# Patient Record
Sex: Male | Born: 1970
Health system: Southern US, Community
[De-identification: ages and names within clinical notes are randomized; demographics above are authoritative.]

## PROBLEM LIST (undated history)

## (undated) HISTORY — PX: ORIF WRIST FRACTURE: SHX2133

## (undated) HISTORY — PX: WRIST SURGERY: SHX841

---

## 2012-05-31 ENCOUNTER — Emergency Department (HOSPITAL_COMMUNITY): Payer: Medicare Other

## 2012-05-31 ENCOUNTER — Encounter (HOSPITAL_COMMUNITY): Payer: Self-pay | Admitting: Anesthesiology

## 2012-05-31 ENCOUNTER — Encounter (HOSPITAL_COMMUNITY)
Admission: EM | Disposition: A | Discharge status: Home / Self Care | Payer: Self-pay | Source: Home / Self Care | Attending: Orthopaedic Surgery

## 2012-05-31 ENCOUNTER — Inpatient Hospital Stay (HOSPITAL_COMMUNITY)
Admission: EM | Admit: 2012-05-31 | Discharge: 2012-06-06 | DRG: 481 | Disposition: A | Discharge status: Home / Self Care | Payer: Medicare Other | Attending: Orthopaedic Surgery | Admitting: Orthopaedic Surgery

## 2012-05-31 ENCOUNTER — Inpatient Hospital Stay (HOSPITAL_COMMUNITY): Payer: Medicare Other

## 2012-05-31 ENCOUNTER — Emergency Department (HOSPITAL_COMMUNITY): Payer: Medicare Other | Admitting: Anesthesiology

## 2012-05-31 DIAGNOSIS — S72302A Unspecified fracture of shaft of left femur, initial encounter for closed fracture: Secondary | ICD-10-CM | POA: Diagnosis present

## 2012-05-31 DIAGNOSIS — S82109A Unspecified fracture of upper end of unspecified tibia, initial encounter for closed fracture: Secondary | ICD-10-CM | POA: Diagnosis present

## 2012-05-31 DIAGNOSIS — S7290XA Unspecified fracture of unspecified femur, initial encounter for closed fracture: Secondary | ICD-10-CM

## 2012-05-31 DIAGNOSIS — S86909A Unspecified injury of unspecified muscle(s) and tendon(s) at lower leg level, unspecified leg, initial encounter: Secondary | ICD-10-CM | POA: Diagnosis present

## 2012-05-31 DIAGNOSIS — S81809A Unspecified open wound, unspecified lower leg, initial encounter: Secondary | ICD-10-CM

## 2012-05-31 DIAGNOSIS — S92109A Unspecified fracture of unspecified talus, initial encounter for closed fracture: Secondary | ICD-10-CM | POA: Diagnosis present

## 2012-05-31 DIAGNOSIS — S82122A Displaced fracture of lateral condyle of left tibia, initial encounter for closed fracture: Secondary | ICD-10-CM | POA: Diagnosis present

## 2012-05-31 DIAGNOSIS — S92102A Unspecified fracture of left talus, initial encounter for closed fracture: Secondary | ICD-10-CM | POA: Diagnosis present

## 2012-05-31 DIAGNOSIS — S91009A Unspecified open wound, unspecified ankle, initial encounter: Secondary | ICD-10-CM

## 2012-05-31 DIAGNOSIS — S7292XA Unspecified fracture of left femur, initial encounter for closed fracture: Secondary | ICD-10-CM

## 2012-05-31 DIAGNOSIS — D62 Acute posthemorrhagic anemia: Secondary | ICD-10-CM

## 2012-05-31 DIAGNOSIS — S72309A Unspecified fracture of shaft of unspecified femur, initial encounter for closed fracture: Principal | ICD-10-CM | POA: Diagnosis present

## 2012-05-31 DIAGNOSIS — S86819A Strain of other muscle(s) and tendon(s) at lower leg level, unspecified leg, initial encounter: Secondary | ICD-10-CM | POA: Diagnosis present

## 2012-05-31 DIAGNOSIS — S82209A Unspecified fracture of shaft of unspecified tibia, initial encounter for closed fracture: Secondary | ICD-10-CM

## 2012-05-31 DIAGNOSIS — S43203A Unspecified subluxation of unspecified sternoclavicular joint, initial encounter: Secondary | ICD-10-CM | POA: Diagnosis present

## 2012-05-31 DIAGNOSIS — T07XXXA Unspecified multiple injuries, initial encounter: Secondary | ICD-10-CM

## 2012-05-31 DIAGNOSIS — S81009A Unspecified open wound, unspecified knee, initial encounter: Secondary | ICD-10-CM

## 2012-05-31 HISTORY — PX: EXTERNAL FIXATION LEG: SHX1549

## 2012-05-31 HISTORY — PX: I & D EXTREMITY: SHX5045

## 2012-05-31 LAB — CBC WITH DIFFERENTIAL/PLATELET
Basophils Relative: 0 % (ref 0–1)
Eosinophils Relative: 0 % (ref 0–5)
Hemoglobin: 14 g/dL (ref 13.0–17.0)
Monocytes Relative: 7 % (ref 3–12)
Neutrophils Relative %: 71 % (ref 43–77)
Platelets: 193 10*3/uL (ref 150–400)
RBC: 4.59 MIL/uL (ref 4.22–5.81)
WBC: 15.4 10*3/uL — ABNORMAL HIGH (ref 4.0–10.5)

## 2012-05-31 LAB — POCT I-STAT, CHEM 8
Calcium, Ion: 1.12 mmol/L (ref 1.12–1.23)
Glucose, Bld: 142 mg/dL — ABNORMAL HIGH (ref 70–99)
HCT: 41 % (ref 39.0–52.0)
Hemoglobin: 13.9 g/dL (ref 13.0–17.0)
Potassium: 3.5 mEq/L (ref 3.5–5.1)

## 2012-05-31 LAB — COMPREHENSIVE METABOLIC PANEL
Albumin: 3.4 g/dL — ABNORMAL LOW (ref 3.5–5.2)
BUN: 12 mg/dL (ref 6–23)
Chloride: 106 mEq/L (ref 96–112)
Creatinine, Ser: 1.21 mg/dL (ref 0.50–1.35)
Total Bilirubin: 0.2 mg/dL — ABNORMAL LOW (ref 0.3–1.2)

## 2012-05-31 LAB — PROTIME-INR
INR: 1.05 (ref 0.00–1.49)
Prothrombin Time: 13.6 seconds (ref 11.6–15.2)

## 2012-05-31 LAB — ABO/RH: ABO/RH(D): O POS

## 2012-05-31 SURGERY — IRRIGATION AND DEBRIDEMENT EXTREMITY
Anesthesia: General | Site: Leg Upper | Laterality: Left | Wound class: Contaminated

## 2012-05-31 MED ORDER — HYDROMORPHONE HCL PF 1 MG/ML IJ SOLN
1.0000 mg | Freq: Once | INTRAMUSCULAR | Status: AC
  Administered 2012-05-31: 1 mg via INTRAVENOUS
  Filled 2012-05-31: qty 1

## 2012-05-31 MED ORDER — ROCURONIUM BROMIDE 100 MG/10ML IV SOLN
INTRAVENOUS | Status: DC | PRN
  Administered 2012-05-31: 40 mg via INTRAVENOUS

## 2012-05-31 MED ORDER — HYDROMORPHONE HCL PF 1 MG/ML IJ SOLN
1.0000 mg | INTRAMUSCULAR | Status: DC | PRN
  Administered 2012-05-31 – 2012-06-01 (×4): 1 mg via INTRAVENOUS
  Filled 2012-05-31 (×4): qty 1

## 2012-05-31 MED ORDER — FENTANYL CITRATE 0.05 MG/ML IJ SOLN
INTRAMUSCULAR | Status: AC
  Filled 2012-05-31: qty 4

## 2012-05-31 MED ORDER — PROPOFOL 10 MG/ML IV BOLUS
INTRAVENOUS | Status: DC | PRN
  Administered 2012-05-31: 200 mg via INTRAVENOUS

## 2012-05-31 MED ORDER — ENOXAPARIN SODIUM 30 MG/0.3ML ~~LOC~~ SOLN
30.0000 mg | Freq: Two times a day (BID) | SUBCUTANEOUS | Status: DC
  Administered 2012-05-31: 30 mg via SUBCUTANEOUS
  Filled 2012-05-31 (×4): qty 0.3

## 2012-05-31 MED ORDER — METHOCARBAMOL 500 MG PO TABS
500.0000 mg | ORAL_TABLET | Freq: Four times a day (QID) | ORAL | Status: DC | PRN
  Administered 2012-05-31 – 2012-06-02 (×5): 500 mg via ORAL
  Filled 2012-05-31 (×4): qty 1

## 2012-05-31 MED ORDER — FENTANYL CITRATE 0.05 MG/ML IJ SOLN
INTRAMUSCULAR | Status: AC
  Filled 2012-05-31: qty 2

## 2012-05-31 MED ORDER — CEFAZOLIN SODIUM 1-5 GM-% IV SOLN
1.0000 g | Freq: Three times a day (TID) | INTRAVENOUS | Status: DC
  Administered 2012-05-31 – 2012-06-03 (×10): 1 g via INTRAVENOUS
  Filled 2012-05-31 (×14): qty 50

## 2012-05-31 MED ORDER — ZOLPIDEM TARTRATE 5 MG PO TABS: 5.0000 mg | ORAL_TABLET | Freq: Every evening | ORAL | Status: DC | PRN

## 2012-05-31 MED ORDER — SUCCINYLCHOLINE CHLORIDE 20 MG/ML IJ SOLN
INTRAMUSCULAR | Status: DC | PRN
  Administered 2012-05-31: 100 mg via INTRAVENOUS

## 2012-05-31 MED ORDER — METHOCARBAMOL 100 MG/ML IJ SOLN
500.0000 mg | Freq: Four times a day (QID) | INTRAVENOUS | Status: DC | PRN
  Administered 2012-06-01: 500 mg via INTRAVENOUS
  Filled 2012-05-31 (×3): qty 5

## 2012-05-31 MED ORDER — METOCLOPRAMIDE HCL 5 MG/ML IJ SOLN: 10.0000 mg | Freq: Once | INTRAMUSCULAR | Status: DC | PRN

## 2012-05-31 MED ORDER — SODIUM CHLORIDE 0.9 % IV SOLN
10.0000 mg | INTRAVENOUS | Status: DC | PRN
  Administered 2012-05-31: 25 ug/min via INTRAVENOUS

## 2012-05-31 MED ORDER — DIPHENHYDRAMINE HCL 12.5 MG/5ML PO ELIX
12.5000 mg | ORAL_SOLUTION | ORAL | Status: DC | PRN
  Administered 2012-06-02: 25 mg via ORAL

## 2012-05-31 MED ORDER — SODIUM CHLORIDE 0.9 % IV SOLN
INTRAVENOUS | Status: DC
  Administered 2012-06-01: 01:00:00 via INTRAVENOUS

## 2012-05-31 MED ORDER — OXYCODONE HCL 5 MG/5ML PO SOLN: 5.0000 mg | Freq: Once | ORAL | Status: DC | PRN

## 2012-05-31 MED ORDER — HYDROMORPHONE HCL PF 1 MG/ML IJ SOLN
0.2500 mg | INTRAMUSCULAR | Status: DC | PRN
  Administered 2012-05-31 (×2): 0.5 mg via INTRAVENOUS

## 2012-05-31 MED ORDER — CEFAZOLIN SODIUM-DEXTROSE 2-3 GM-% IV SOLR
INTRAVENOUS | Status: DC | PRN
  Administered 2012-05-31: 2 g via INTRAVENOUS

## 2012-05-31 MED ORDER — FENTANYL CITRATE 0.05 MG/ML IJ SOLN
150.0000 ug | Freq: Once | INTRAMUSCULAR | Status: AC
  Administered 2012-05-31: 150 ug via INTRAVENOUS

## 2012-05-31 MED ORDER — PHENYLEPHRINE HCL 10 MG/ML IJ SOLN
INTRAMUSCULAR | Status: DC | PRN
  Administered 2012-05-31 (×4): 80 ug via INTRAVENOUS

## 2012-05-31 MED ORDER — IOHEXOL 300 MG/ML  SOLN
100.0000 mL | Freq: Once | INTRAMUSCULAR | Status: AC | PRN
  Administered 2012-05-31: 100 mL via INTRAVENOUS

## 2012-05-31 MED ORDER — OXYCODONE HCL 5 MG PO TABS: 5.0000 mg | ORAL_TABLET | Freq: Once | ORAL | Status: DC | PRN

## 2012-05-31 MED ORDER — METOCLOPRAMIDE HCL 5 MG/ML IJ SOLN: 5.0000 mg | Freq: Three times a day (TID) | INTRAMUSCULAR | Status: DC | PRN

## 2012-05-31 MED ORDER — LACTATED RINGERS IV SOLN
INTRAVENOUS | Status: DC | PRN
  Administered 2012-05-31 (×3): via INTRAVENOUS

## 2012-05-31 MED ORDER — ONDANSETRON HCL 4 MG PO TABS: 4.0000 mg | ORAL_TABLET | Freq: Four times a day (QID) | ORAL | Status: DC | PRN

## 2012-05-31 MED ORDER — TETANUS-DIPHTHERIA TOXOIDS TD 5-2 LFU IM INJ
0.5000 mL | INJECTION | Freq: Once | INTRAMUSCULAR | Status: AC
  Administered 2012-05-31: 0.5 mL via INTRAMUSCULAR
  Filled 2012-05-31: qty 0.5

## 2012-05-31 MED ORDER — NEOSTIGMINE METHYLSULFATE 1 MG/ML IJ SOLN
INTRAMUSCULAR | Status: DC | PRN
  Administered 2012-05-31: 2 mg via INTRAVENOUS

## 2012-05-31 MED ORDER — ALBUMIN HUMAN 5 % IV SOLN
INTRAVENOUS | Status: DC | PRN
  Administered 2012-05-31: 06:00:00 via INTRAVENOUS

## 2012-05-31 MED ORDER — HYDROCODONE-ACETAMINOPHEN 5-325 MG PO TABS: 1.0000 | ORAL_TABLET | ORAL | Status: DC | PRN

## 2012-05-31 MED ORDER — DOCUSATE SODIUM 100 MG PO CAPS
100.0000 mg | ORAL_CAPSULE | Freq: Two times a day (BID) | ORAL | Status: DC
  Administered 2012-05-31 – 2012-06-03 (×4): 100 mg via ORAL
  Filled 2012-05-31 (×9): qty 1

## 2012-05-31 MED ORDER — LIDOCAINE HCL (CARDIAC) 20 MG/ML IV SOLN
INTRAVENOUS | Status: DC | PRN
  Administered 2012-05-31: 100 mg via INTRAVENOUS

## 2012-05-31 MED ORDER — MIDAZOLAM HCL 5 MG/5ML IJ SOLN
INTRAMUSCULAR | Status: DC | PRN
  Administered 2012-05-31: 2 mg via INTRAVENOUS

## 2012-05-31 MED ORDER — FENTANYL CITRATE 0.05 MG/ML IJ SOLN
100.0000 ug | Freq: Once | INTRAMUSCULAR | Status: AC
  Administered 2012-05-31: 100 ug via INTRAVENOUS

## 2012-05-31 MED ORDER — MORPHINE SULFATE 2 MG/ML IJ SOLN: 1.0000 mg | INTRAMUSCULAR | Status: DC | PRN

## 2012-05-31 MED ORDER — OXYCODONE HCL 5 MG PO TABS
5.0000 mg | ORAL_TABLET | ORAL | Status: DC | PRN
  Administered 2012-05-31 – 2012-06-06 (×8): 10 mg via ORAL
  Filled 2012-05-31 (×2): qty 2
  Filled 2012-05-31: qty 1
  Filled 2012-05-31 (×5): qty 2

## 2012-05-31 MED ORDER — METOCLOPRAMIDE HCL 10 MG PO TABS: 5.0000 mg | ORAL_TABLET | Freq: Three times a day (TID) | ORAL | Status: DC | PRN

## 2012-05-31 MED ORDER — GLYCOPYRROLATE 0.2 MG/ML IJ SOLN
INTRAMUSCULAR | Status: DC | PRN
  Administered 2012-05-31: 0.4 mg via INTRAVENOUS

## 2012-05-31 MED ORDER — ONDANSETRON HCL 4 MG/2ML IJ SOLN: 4.0000 mg | Freq: Four times a day (QID) | INTRAMUSCULAR | Status: DC | PRN

## 2012-05-31 SURGICAL SUPPLY — 70 items
BANDAGE CONFORM 3  STR LF (GAUZE/BANDAGES/DRESSINGS) IMPLANT
BANDAGE ELASTIC 3 VELCRO ST LF (GAUZE/BANDAGES/DRESSINGS) IMPLANT
BANDAGE ELASTIC 4 VELCRO ST LF (GAUZE/BANDAGES/DRESSINGS) ×3 IMPLANT
BANDAGE ELASTIC 6 VELCRO ST LF (GAUZE/BANDAGES/DRESSINGS) ×3 IMPLANT
BAR EXFIX SM BONE 10.5X250 (Trauma) ×3 IMPLANT
BAR EXFIX SM BONE 10.5X400 (Trauma) ×6 IMPLANT
BIT DRILL 3.5 SHOFT HALF PIN (BIT) ×3 IMPLANT
BLADE SURG 10 STRL SS (BLADE) IMPLANT
BNDG COHESIVE 1X5 TAN STRL LF (GAUZE/BANDAGES/DRESSINGS) IMPLANT
BNDG COHESIVE 4X5 TAN STRL (GAUZE/BANDAGES/DRESSINGS) IMPLANT
BNDG COHESIVE 6X5 TAN STRL LF (GAUZE/BANDAGES/DRESSINGS) IMPLANT
BNDG GAUZE STRTCH 6 (GAUZE/BANDAGES/DRESSINGS) IMPLANT
CLAMP BAR TO BAR (Clamp) ×9 IMPLANT
CLAMP PIN TO BAR (Clamp) ×30 IMPLANT
CLOTH BEACON ORANGE TIMEOUT ST (SAFETY) ×3 IMPLANT
CORDS BIPOLAR (ELECTRODE) IMPLANT
COVER SURGICAL LIGHT HANDLE (MISCELLANEOUS) ×3 IMPLANT
CUFF TOURNIQUET SINGLE 18IN (TOURNIQUET CUFF) IMPLANT
CUFF TOURNIQUET SINGLE 24IN (TOURNIQUET CUFF) IMPLANT
CUFF TOURNIQUET SINGLE 34IN LL (TOURNIQUET CUFF) IMPLANT
CUFF TOURNIQUET SINGLE 44IN (TOURNIQUET CUFF) IMPLANT
DRAPE C-ARM 42X72 X-RAY (DRAPES) ×6 IMPLANT
DRAPE C-ARMOR (DRAPES) ×3 IMPLANT
DRAPE ORTHO SPLIT 77X108 STRL (DRAPES) ×2
DRAPE SURG 17X23 STRL (DRAPES) IMPLANT
DRAPE SURG ORHT 6 SPLT 77X108 (DRAPES) ×4 IMPLANT
DRAPE U-SHAPE 47X51 STRL (DRAPES) ×3 IMPLANT
DRSG ADAPTIC 3X8 NADH LF (GAUZE/BANDAGES/DRESSINGS) ×9 IMPLANT
DRSG PAD ABDOMINAL 8X10 ST (GAUZE/BANDAGES/DRESSINGS) ×15 IMPLANT
DURAPREP 26ML APPLICATOR (WOUND CARE) IMPLANT
ELECT CAUTERY BLADE 6.4 (BLADE) IMPLANT
ELECT REM PT RETURN 9FT ADLT (ELECTROSURGICAL) ×3
ELECTRODE REM PT RTRN 9FT ADLT (ELECTROSURGICAL) ×2 IMPLANT
GAUZE XEROFORM 1X8 LF (GAUZE/BANDAGES/DRESSINGS) ×3 IMPLANT
GAUZE XEROFORM 5X9 LF (GAUZE/BANDAGES/DRESSINGS) ×3 IMPLANT
GLOVE BIOGEL PI IND STRL 8 (GLOVE) ×2 IMPLANT
GLOVE BIOGEL PI INDICATOR 8 (GLOVE) ×1
GLOVE ORTHO TXT STRL SZ7.5 (GLOVE) ×3 IMPLANT
GOWN PREVENTION PLUS LG XLONG (DISPOSABLE) IMPLANT
GOWN PREVENTION PLUS XLARGE (GOWN DISPOSABLE) ×3 IMPLANT
GOWN STRL NON-REIN LRG LVL3 (GOWN DISPOSABLE) ×3 IMPLANT
HALF PIN 45MM (Pin) ×6 IMPLANT
HALF PIN LONG  5X45 (Pin) ×12 IMPLANT
HANDPIECE INTERPULSE COAX TIP (DISPOSABLE) ×1
KIT BASIN OR (CUSTOM PROCEDURE TRAY) ×3 IMPLANT
KIT ROOM TURNOVER OR (KITS) ×3 IMPLANT
MANIFOLD NEPTUNE II (INSTRUMENTS) ×3 IMPLANT
NS IRRIG 1000ML POUR BTL (IV SOLUTION) ×3 IMPLANT
PACK ORTHO EXTREMITY (CUSTOM PROCEDURE TRAY) ×3 IMPLANT
PAD ARMBOARD 7.5X6 YLW CONV (MISCELLANEOUS) ×6 IMPLANT
PADDING CAST ABS 4INX4YD NS (CAST SUPPLIES) ×2
PADDING CAST ABS COTTON 4X4 ST (CAST SUPPLIES) ×4 IMPLANT
PADDING CAST COTTON 6X4 STRL (CAST SUPPLIES) ×6 IMPLANT
SET HNDPC FAN SPRY TIP SCT (DISPOSABLE) ×2 IMPLANT
SPONGE GAUZE 4X4 12PLY (GAUZE/BANDAGES/DRESSINGS) ×3 IMPLANT
SPONGE LAP 18X18 X RAY DECT (DISPOSABLE) ×6 IMPLANT
STOCKINETTE IMPERVIOUS 9X36 MD (GAUZE/BANDAGES/DRESSINGS) IMPLANT
SUT ETHILON 2 0 FS 18 (SUTURE) ×12 IMPLANT
SUT ETHILON 3 0 PS 1 (SUTURE) ×6 IMPLANT
SUT VIC AB 2-0 CT1 27 (SUTURE)
SUT VIC AB 2-0 CT1 TAPERPNT 27 (SUTURE) IMPLANT
SYR CONTROL 10ML LL (SYRINGE) IMPLANT
TOWEL OR 17X24 6PK STRL BLUE (TOWEL DISPOSABLE) ×3 IMPLANT
TOWEL OR 17X26 10 PK STRL BLUE (TOWEL DISPOSABLE) ×3 IMPLANT
TUBE ANAEROBIC SPECIMEN COL (MISCELLANEOUS) IMPLANT
TUBE CONNECTING 12X1/4 (SUCTIONS) ×3 IMPLANT
TUBE FEEDING 5FR 15 INCH (TUBING) IMPLANT
UNDERPAD 30X30 INCONTINENT (UNDERPADS AND DIAPERS) ×3 IMPLANT
WATER STERILE IRR 1000ML POUR (IV SOLUTION) ×3 IMPLANT
YANKAUER SUCT BULB TIP NO VENT (SUCTIONS) ×3 IMPLANT
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
## 2012-05-31 NOTE — ED Notes (Signed)
Pt transported to CT/ radiology dept via stretcher (with monitor/ O2) with assigned RN and tech.

## 2012-05-31 NOTE — Preoperative (Signed)
Beta Blockers   Reason not to administer Beta Blockers:Not Applicable 

## 2012-05-31 NOTE — Anesthesia Preprocedure Evaluation (Addendum)
Anesthesia Evaluation  Patient identified by MRN, date of birth, ID band Patient awake    Reviewed: Allergy & Precautions, H&P , NPO status , Patient's Chart, lab work & pertinent test results, reviewed documented beta blocker date and time   History of Anesthesia Complications Negative for: history of anesthetic complications  Airway Mallampati: I TM Distance: >3 FB Neck ROM: full    Dental  (+) Teeth Intact and Dental Advisory Given   Pulmonary neg pulmonary ROS,  breath sounds clear to auscultation  Pulmonary exam normal       Cardiovascular negative cardio ROS  Rhythm:regular     Neuro/Psych  Neuromuscular disease  C-spine not cleared negative psych ROS   GI/Hepatic negative GI ROS, Neg liver ROS,   Endo/Other  negative endocrine ROS  Renal/GU negative Renal ROS  negative genitourinary   Musculoskeletal negative musculoskeletal ROS (+)   Abdominal   Peds  Hematology negative hematology ROS (+)   Anesthesia Other Findings See surgeon's H&P   Reproductive/Obstetrics negative OB ROS                          Anesthesia Physical Anesthesia Plan  ASA: III and emergent  Anesthesia Plan: General   Post-op Pain Management:    Induction: Intravenous, Rapid sequence and Cricoid pressure planned  Airway Management Planned: Oral ETT  Additional Equipment:   Intra-op Plan:   Post-operative Plan: Extubation in OR  Informed Consent: I have reviewed the patients History and Physical, chart, labs and discussed the procedure including the risks, benefits and alternatives for the proposed anesthesia with the patient or authorized representative who has indicated his/her understanding and acceptance.   Dental advisory given  Plan Discussed with: Anesthesiologist and Surgeon  Anesthesia Plan Comments:        Anesthesia Quick Evaluation

## 2012-05-31 NOTE — Anesthesia Procedure Notes (Signed)
Procedure Name: Intubation Date/Time: 05/31/2012 5:15 AM Performed by: Molli Hazard Pre-anesthesia Checklist: Patient identified, Emergency Drugs available, Suction available and Patient being monitored Patient Re-evaluated:Patient Re-evaluated prior to inductionOxygen Delivery Method: Circle system utilized Preoxygenation: Pre-oxygenation with 100% oxygen Intubation Type: IV induction, Rapid sequence and Cricoid Pressure applied Laryngoscope size: glidescope. Grade View: Grade I Tube type: Oral Tube size: 7.5 mm Number of attempts: 1 Airway Equipment and Method: Video-laryngoscopy Placement Confirmation: ETT inserted through vocal cords under direct vision,  positive ETCO2 and breath sounds checked- equal and bilateral Secured at: 23 cm Tube secured with: Tape Dental Injury: Teeth and Oropharynx as per pre-operative assessment  Comments: Cervical collar unfastened during intubation. Neck kept neutral. Collar reapplied after intubation.
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
## 2012-05-31 NOTE — ED Notes (Signed)
Pt transported to OR via stretcher with monitor/ O2, accompanied by assigned RN and tech.
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
## 2012-05-31 NOTE — ED Notes (Signed)
Pt returned to ED from CT/ radiology via stretcher, accompanied by assigned RN (with monitor and O2).  Dr. Magnus Ivan arrived to see pt while in radiology at 0330).  Pt tolerated imaging well.  Reports pain level as 6/10 and denies need for pain medication at this time.  Personnel officer at bedside upon return to ED.  Blood specimen collected from left upper arm (using officer supplied kit) by assigned Charity fundraiser.  Pt complaint with answering officer's questions and with blood draw.
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
## 2012-05-31 NOTE — Anesthesia Postprocedure Evaluation (Signed)
  Anesthesia Post-op Note  Patient: [name]  Procedure(s) Performed: Procedure(s) (LRB) with comments: IRRIGATION AND DEBRIDEMENT EXTREMITY (Left) EXTERNAL FIXATION LEG (Left) - left whole leg  Patient Location: PACU  Anesthesia Type:General  Level of Consciousness: awake  Airway and Oxygen Therapy: Patient Spontanous Breathing  Post-op Pain: mild  Post-op Assessment: Post-op Vital signs reviewed  Post-op Vital Signs: Reviewed  Complications: No apparent anesthesia complications
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
## 2012-05-31 NOTE — Progress Notes (Signed)
Subjective: Day of Surgery Procedure(s) (LRB): IRRIGATION AND DEBRIDEMENT EXTREMITY (Left) EXTERNAL FIXATION LEG (Left) Patient reports pain as mild.    Objective: Vital signs in last 24 hours: Temp:  [97.5 F (36.4 C)-97.8 F (36.6 C)] 97.7 F (36.5 C) (12/15 0920) Pulse Rate:  [93-118] 114  (12/15 0920) Resp:  [13-23] 18  (12/15 0920) BP: (95-149)/(64-99) 129/88 mmHg (12/15 0920) SpO2:  [97 %-100 %] 100 % (12/15 0920)  Intake/Output from previous day: 12/14 0701 - 12/15 0700 In: 2250 [I.V.:2000; IV Piggyback:250] Out: 1075 [Urine:1075] Intake/Output this shift: Total I/O In: 300 [I.V.:300] Out: 155 [Urine:30; Blood:125]   Basename 05/31/12 0242 05/31/12 0238  HGB 14.0 13.9    Basename 05/31/12 0242 05/31/12 0238  WBC 15.4* --  RBC 4.59 --  HCT 40.6 41.0  PLT 193 --    Basename 05/31/12 0242 05/31/12 0238  NA 142 144  K 3.9 3.5  CL 106 108  CO2 21 --  BUN 12 12  CREATININE 1.21 1.30  GLUCOSE 149* 142*  CALCIUM 8.8 --    Basename 05/31/12 0242  LABPT --  INR 1.05    Compartment soft Dressing dry. Ext. Fix  Intact.  Assessment/Plan: Day of Surgery Procedure(s) (LRB): IRRIGATION AND DEBRIDEMENT EXTREMITY (Left) EXTERNAL FIXATION LEG (Left) Plan:    Elevation,   ORIF tib and femoral nail in a few days likely  Niamh Rada C 05/31/2012, 11:20 AM
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
## 2012-06-01 ENCOUNTER — Encounter (HOSPITAL_COMMUNITY): Payer: Self-pay | Admitting: Anesthesiology

## 2012-06-01 ENCOUNTER — Inpatient Hospital Stay (HOSPITAL_COMMUNITY): Payer: Medicare Other

## 2012-06-01 ENCOUNTER — Encounter (HOSPITAL_COMMUNITY)
Admission: EM | Disposition: A | Discharge status: Home / Self Care | Payer: Self-pay | Source: Home / Self Care | Attending: Orthopaedic Surgery

## 2012-06-01 ENCOUNTER — Inpatient Hospital Stay (HOSPITAL_COMMUNITY): Payer: Medicare Other | Admitting: Anesthesiology

## 2012-06-01 ENCOUNTER — Encounter (HOSPITAL_COMMUNITY): Payer: Self-pay | Admitting: Orthopedic Surgery

## 2012-06-01 DIAGNOSIS — D62 Acute posthemorrhagic anemia: Secondary | ICD-10-CM

## 2012-06-01 DIAGNOSIS — S92102A Unspecified fracture of left talus, initial encounter for closed fracture: Secondary | ICD-10-CM | POA: Diagnosis present

## 2012-06-01 HISTORY — PX: PATELLAR TENDON REPAIR: SHX737

## 2012-06-01 HISTORY — PX: ORIF TIBIA PLATEAU: SHX2132

## 2012-06-01 HISTORY — PX: I & D EXTREMITY: SHX5045

## 2012-06-01 HISTORY — PX: FEMUR IM NAIL: SHX1597

## 2012-06-01 LAB — POCT I-STAT 4, (NA,K, GLUC, HGB,HCT)
Glucose, Bld: 131 mg/dL — ABNORMAL HIGH (ref 70–99)
HCT: 20 % — ABNORMAL LOW (ref 39.0–52.0)
HCT: 21 % — ABNORMAL LOW (ref 39.0–52.0)
Hemoglobin: 6.8 g/dL — CL (ref 13.0–17.0)
Hemoglobin: 7.1 g/dL — ABNORMAL LOW (ref 13.0–17.0)
Potassium: 4.4 mEq/L (ref 3.5–5.1)
Sodium: 135 mEq/L (ref 135–145)
Sodium: 135 mEq/L (ref 135–145)

## 2012-06-01 LAB — BASIC METABOLIC PANEL
CO2: 25 mEq/L (ref 19–32)
Chloride: 100 mEq/L (ref 96–112)
Glucose, Bld: 130 mg/dL — ABNORMAL HIGH (ref 70–99)
Potassium: 3.5 mEq/L (ref 3.5–5.1)
Sodium: 134 mEq/L — ABNORMAL LOW (ref 135–145)

## 2012-06-01 LAB — CBC
Hemoglobin: 8.1 g/dL — ABNORMAL LOW (ref 13.0–17.0)
MCH: 29.9 pg (ref 26.0–34.0)
MCV: 86.7 fL (ref 78.0–100.0)
RBC: 2.71 MIL/uL — ABNORMAL LOW (ref 4.22–5.81)

## 2012-06-01 LAB — LACTIC ACID, PLASMA: Lactic Acid, Venous: 1.2 mmol/L (ref 0.5–2.2)

## 2012-06-01 LAB — PREPARE RBC (CROSSMATCH)

## 2012-06-01 SURGERY — IRRIGATION AND DEBRIDEMENT EXTREMITY
Anesthesia: General | Site: Leg Upper | Laterality: Left | Wound class: Clean

## 2012-06-01 MED ORDER — INFLUENZA VIRUS VACC SPLIT PF IM SUSP
0.5000 mL | INTRAMUSCULAR | Status: AC
  Administered 2012-06-02: 0.5 mL via INTRAMUSCULAR
  Filled 2012-06-01: qty 0.5

## 2012-06-01 MED ORDER — HYDROMORPHONE 0.3 MG/ML IV SOLN
INTRAVENOUS | Status: DC
  Administered 2012-06-02: 2.7 mg via INTRAVENOUS
  Administered 2012-06-02: 11:00:00 via INTRAVENOUS
  Administered 2012-06-02: 2.1 mg via INTRAVENOUS
  Administered 2012-06-02: 2.62 mg via INTRAVENOUS
  Administered 2012-06-02: 01:00:00 via INTRAVENOUS
  Administered 2012-06-02: 2.99 mg via INTRAVENOUS
  Administered 2012-06-02: 1.5 mg via INTRAVENOUS
  Administered 2012-06-03: 2.7 mg via INTRAVENOUS
  Administered 2012-06-03: 03:00:00 via INTRAVENOUS
  Administered 2012-06-03: 1 mg via INTRAVENOUS
  Administered 2012-06-03: 1.5 mg via INTRAVENOUS
  Administered 2012-06-03: 1.8 mg via INTRAVENOUS
  Administered 2012-06-03: 1.2 mg via INTRAVENOUS
  Administered 2012-06-04: 13:00:00 via INTRAVENOUS
  Administered 2012-06-04: 2.1 mg via INTRAVENOUS
  Filled 2012-06-01 (×5): qty 25

## 2012-06-01 MED ORDER — DIPHENHYDRAMINE HCL 12.5 MG/5ML PO ELIX
12.5000 mg | ORAL_SOLUTION | Freq: Four times a day (QID) | ORAL | Status: DC | PRN
  Filled 2012-06-01: qty 10

## 2012-06-01 MED ORDER — HYDROMORPHONE HCL PF 1 MG/ML IJ SOLN
INTRAMUSCULAR | Status: AC
  Filled 2012-06-01: qty 1

## 2012-06-01 MED ORDER — LACTATED RINGERS IV SOLN
INTRAVENOUS | Status: DC | PRN
  Administered 2012-06-01: 14:00:00 via INTRAVENOUS

## 2012-06-01 MED ORDER — PROPOFOL 10 MG/ML IV BOLUS
INTRAVENOUS | Status: DC | PRN
  Administered 2012-06-01: 170 mg via INTRAVENOUS

## 2012-06-01 MED ORDER — GLYCOPYRROLATE 0.2 MG/ML IJ SOLN
INTRAMUSCULAR | Status: DC | PRN
  Administered 2012-06-01: .5 mg via INTRAVENOUS

## 2012-06-01 MED ORDER — ONDANSETRON HCL 4 MG/2ML IJ SOLN
INTRAMUSCULAR | Status: DC | PRN
  Administered 2012-06-01: 4 mg via INTRAVENOUS

## 2012-06-01 MED ORDER — LACTATED RINGERS IV SOLN
INTRAVENOUS | Status: DC | PRN
  Administered 2012-06-01 (×2): via INTRAVENOUS

## 2012-06-01 MED ORDER — NALOXONE HCL 0.4 MG/ML IJ SOLN: 0.4000 mg | INTRAMUSCULAR | Status: DC | PRN

## 2012-06-01 MED ORDER — ALBUMIN HUMAN 5 % IV SOLN
INTRAVENOUS | Status: DC | PRN
  Administered 2012-06-01 (×3): via INTRAVENOUS

## 2012-06-01 MED ORDER — ONDANSETRON HCL 4 MG/2ML IJ SOLN: 4.0000 mg | Freq: Four times a day (QID) | INTRAMUSCULAR | Status: DC | PRN

## 2012-06-01 MED ORDER — OXYCODONE HCL 5 MG PO TABS: 5.0000 mg | ORAL_TABLET | Freq: Once | ORAL | Status: DC | PRN

## 2012-06-01 MED ORDER — FENTANYL CITRATE 0.05 MG/ML IJ SOLN
INTRAMUSCULAR | Status: DC | PRN
  Administered 2012-06-01 (×2): 100 ug via INTRAVENOUS
  Administered 2012-06-01 (×4): 50 ug via INTRAVENOUS
  Administered 2012-06-01 (×2): 100 ug via INTRAVENOUS

## 2012-06-01 MED ORDER — PROMETHAZINE HCL 25 MG/ML IJ SOLN: 6.2500 mg | INTRAMUSCULAR | Status: DC | PRN

## 2012-06-01 MED ORDER — OXYCODONE HCL 5 MG/5ML PO SOLN: 5.0000 mg | Freq: Once | ORAL | Status: DC | PRN

## 2012-06-01 MED ORDER — CEFAZOLIN SODIUM-DEXTROSE 2-3 GM-% IV SOLR
2.0000 g | Freq: Once | INTRAVENOUS | Status: AC
  Administered 2012-06-01: 2 g via INTRAVENOUS

## 2012-06-01 MED ORDER — SODIUM CHLORIDE 0.9 % IJ SOLN: 9.0000 mL | INTRAMUSCULAR | Status: DC | PRN

## 2012-06-01 MED ORDER — MIDAZOLAM HCL 5 MG/5ML IJ SOLN
INTRAMUSCULAR | Status: DC | PRN
  Administered 2012-06-01: 2 mg via INTRAVENOUS

## 2012-06-01 MED ORDER — POLYETHYLENE GLYCOL 3350 17 G PO PACK
17.0000 g | PACK | Freq: Every day | ORAL | Status: DC
  Administered 2012-06-02 – 2012-06-06 (×4): 17 g via ORAL
  Filled 2012-06-01 (×5): qty 1

## 2012-06-01 MED ORDER — LABETALOL HCL 5 MG/ML IV SOLN
5.0000 mg | INTRAVENOUS | Status: DC | PRN
  Administered 2012-06-01: 5 mg via INTRAVENOUS
  Administered 2012-06-01: 10 mg via INTRAVENOUS
  Administered 2012-06-01: 5 mg via INTRAVENOUS
  Filled 2012-06-01: qty 4

## 2012-06-01 MED ORDER — SODIUM CHLORIDE 0.9 % IV SOLN
INTRAVENOUS | Status: DC | PRN
  Administered 2012-06-01: 14:00:00 via INTRAVENOUS

## 2012-06-01 MED ORDER — LACTATED RINGERS IV SOLN
INTRAVENOUS | Status: DC
  Administered 2012-06-01 – 2012-06-04 (×4): via INTRAVENOUS

## 2012-06-01 MED ORDER — NEOSTIGMINE METHYLSULFATE 1 MG/ML IJ SOLN
INTRAMUSCULAR | Status: DC | PRN
  Administered 2012-06-01: 4 mg via INTRAVENOUS

## 2012-06-01 MED ORDER — ARTIFICIAL TEARS OP OINT
TOPICAL_OINTMENT | OPHTHALMIC | Status: DC | PRN
  Administered 2012-06-01: 1 via OPHTHALMIC

## 2012-06-01 MED ORDER — CEFAZOLIN SODIUM 1-5 GM-% IV SOLN
INTRAVENOUS | Status: AC
  Filled 2012-06-01: qty 100

## 2012-06-01 MED ORDER — ENOXAPARIN SODIUM 40 MG/0.4ML ~~LOC~~ SOLN
40.0000 mg | SUBCUTANEOUS | Status: DC
  Administered 2012-06-02 – 2012-06-06 (×4): 40 mg via SUBCUTANEOUS
  Filled 2012-06-01 (×6): qty 0.4

## 2012-06-01 MED ORDER — METOCLOPRAMIDE HCL 10 MG PO TABS: 5.0000 mg | ORAL_TABLET | Freq: Three times a day (TID) | ORAL | Status: DC | PRN

## 2012-06-01 MED ORDER — VECURONIUM BROMIDE 10 MG IV SOLR
INTRAVENOUS | Status: DC | PRN
  Administered 2012-06-01 (×3): 2 mg via INTRAVENOUS
  Administered 2012-06-01: 4 mg via INTRAVENOUS
  Administered 2012-06-01: 2 mg via INTRAVENOUS

## 2012-06-01 MED ORDER — MEPERIDINE HCL 25 MG/ML IJ SOLN: 6.2500 mg | INTRAMUSCULAR | Status: DC | PRN

## 2012-06-01 MED ORDER — DIPHENHYDRAMINE HCL 50 MG/ML IJ SOLN: 12.5000 mg | Freq: Four times a day (QID) | INTRAMUSCULAR | Status: DC | PRN

## 2012-06-01 MED ORDER — ROCURONIUM BROMIDE 100 MG/10ML IV SOLN
INTRAVENOUS | Status: DC | PRN
  Administered 2012-06-01: 50 mg via INTRAVENOUS

## 2012-06-01 MED ORDER — LIDOCAINE HCL 4 % MT SOLN
OROMUCOSAL | Status: DC | PRN
  Administered 2012-06-01: 4 mL via TOPICAL

## 2012-06-01 MED ORDER — FUROSEMIDE 10 MG/ML IJ SOLN: 20.0000 mg | Freq: Once | INTRAMUSCULAR | Status: DC

## 2012-06-01 MED ORDER — DOCUSATE SODIUM 100 MG PO CAPS
100.0000 mg | ORAL_CAPSULE | Freq: Two times a day (BID) | ORAL | Status: DC
  Administered 2012-06-02 – 2012-06-06 (×6): 100 mg via ORAL
  Filled 2012-06-01 (×12): qty 1

## 2012-06-01 MED ORDER — LABETALOL HCL 5 MG/ML IV SOLN
INTRAVENOUS | Status: AC
  Administered 2012-06-01: 5 mg via INTRAVENOUS
  Filled 2012-06-01: qty 4

## 2012-06-01 MED ORDER — ONDANSETRON HCL 4 MG PO TABS: 4.0000 mg | ORAL_TABLET | Freq: Four times a day (QID) | ORAL | Status: DC | PRN

## 2012-06-01 MED ORDER — FERROUS SULFATE 325 (65 FE) MG PO TABS
325.0000 mg | ORAL_TABLET | Freq: Three times a day (TID) | ORAL | Status: DC
  Administered 2012-06-02 – 2012-06-06 (×12): 325 mg via ORAL
  Filled 2012-06-01 (×16): qty 1

## 2012-06-01 MED ORDER — LIDOCAINE HCL (CARDIAC) 20 MG/ML IV SOLN
INTRAVENOUS | Status: DC | PRN
  Administered 2012-06-01: 80 mg via INTRAVENOUS

## 2012-06-01 MED ORDER — ACETAMINOPHEN 10 MG/ML IV SOLN
1000.0000 mg | Freq: Four times a day (QID) | INTRAVENOUS | Status: AC
  Administered 2012-06-01 – 2012-06-02 (×4): 1000 mg via INTRAVENOUS
  Filled 2012-06-01 (×4): qty 100

## 2012-06-01 MED ORDER — FUROSEMIDE 10 MG/ML IJ SOLN
20.0000 mg | Freq: Once | INTRAMUSCULAR | Status: AC
  Administered 2012-06-02: 20 mg via INTRAVENOUS
  Filled 2012-06-01: qty 2

## 2012-06-01 MED ORDER — METOCLOPRAMIDE HCL 5 MG/ML IJ SOLN: 5.0000 mg | Freq: Three times a day (TID) | INTRAMUSCULAR | Status: DC | PRN

## 2012-06-01 MED ORDER — BLISTEX EX OINT
TOPICAL_OINTMENT | CUTANEOUS | Status: DC | PRN
  Filled 2012-06-01: qty 10

## 2012-06-01 MED ORDER — LIP MEDEX EX OINT: TOPICAL_OINTMENT | CUTANEOUS | Status: DC | PRN

## 2012-06-01 MED ORDER — SODIUM CHLORIDE 0.9 % IR SOLN
Status: DC | PRN
  Administered 2012-06-01: 1000 mL
  Administered 2012-06-01 (×2): 3000 mL

## 2012-06-01 MED ORDER — HYDROMORPHONE HCL PF 1 MG/ML IJ SOLN: 0.2500 mg | INTRAMUSCULAR | Status: DC | PRN

## 2012-06-01 SURGICAL SUPPLY — 114 items
BANDAGE ELASTIC 4 VELCRO ST LF (GAUZE/BANDAGES/DRESSINGS) ×4 IMPLANT
BANDAGE ELASTIC 6 VELCRO ST LF (GAUZE/BANDAGES/DRESSINGS) IMPLANT
BANDAGE ESMARK 6X9 LF (GAUZE/BANDAGES/DRESSINGS) IMPLANT
BANDAGE GAUZE ELAST BULKY 4 IN (GAUZE/BANDAGES/DRESSINGS) ×4 IMPLANT
BIT DRILL CALIBRATED 4.3MMX365 (DRILL) ×3 IMPLANT
BIT DRILL CROWE PNT TWST 4.5MM (DRILL) ×3 IMPLANT
BLADE SURG 10 STRL SS (BLADE) ×4 IMPLANT
BLADE SURG 15 STRL LF DISP TIS (BLADE) IMPLANT
BLADE SURG 15 STRL SS (BLADE)
BLADE SURG ROTATE 9660 (MISCELLANEOUS) ×4 IMPLANT
BNDG COHESIVE 4X5 TAN STRL (GAUZE/BANDAGES/DRESSINGS) IMPLANT
BNDG ELASTIC 6X15 VLCR STRL LF (GAUZE/BANDAGES/DRESSINGS) ×4 IMPLANT
BNDG ESMARK 6X9 LF (GAUZE/BANDAGES/DRESSINGS)
BNDG GAUZE STRTCH 6 (GAUZE/BANDAGES/DRESSINGS) IMPLANT
BRUSH SCRUB DISP (MISCELLANEOUS) ×20 IMPLANT
Biomet offset end cap (Orthopedic Implant) ×8 IMPLANT
Biomet retrograde  femoral  nail (Nail) ×4 IMPLANT
CLOTH BEACON ORANGE TIMEOUT ST (SAFETY) ×4 IMPLANT
COVER MAYO STAND STRL (DRAPES) ×4 IMPLANT
COVER SURGICAL LIGHT HANDLE (MISCELLANEOUS) ×4 IMPLANT
DRAIN PENROSE 1/4X12 LTX STRL (WOUND CARE) ×8 IMPLANT
DRAPE C-ARM 42X72 X-RAY (DRAPES) ×8 IMPLANT
DRAPE C-ARMOR (DRAPES) ×4 IMPLANT
DRAPE EXTREMITY T 121X128X90 (DRAPE) ×4 IMPLANT
DRAPE INCISE IOBAN 66X45 STRL (DRAPES) ×4 IMPLANT
DRAPE ORTHO SPLIT 77X108 STRL (DRAPES) ×3
DRAPE PROXIMA HALF (DRAPES) ×4 IMPLANT
DRAPE STERI IOBAN 125X83 (DRAPES) IMPLANT
DRAPE SURG ORHT 6 SPLT 77X108 (DRAPES) ×9 IMPLANT
DRAPE U-SHAPE 47X51 STRL (DRAPES) ×4 IMPLANT
DRILL BIT CANN (BIT) ×4 IMPLANT
DRILL CALIBRATED 4.3MMX365 (DRILL) ×4
DRILL CROWE POINT TWIST 4.5MM (DRILL) ×4
DRSG ADAPTIC 3X8 NADH LF (GAUZE/BANDAGES/DRESSINGS) ×4 IMPLANT
DRSG EMULSION OIL 3X3 NADH (GAUZE/BANDAGES/DRESSINGS) IMPLANT
DRSG MEPILEX BORDER 4X4 (GAUZE/BANDAGES/DRESSINGS) IMPLANT
DRSG MEPILEX BORDER 4X8 (GAUZE/BANDAGES/DRESSINGS) IMPLANT
DRSG PAD ABDOMINAL 8X10 ST (GAUZE/BANDAGES/DRESSINGS) ×4 IMPLANT
ELECT CAUTERY BLADE 6.4 (BLADE) ×4 IMPLANT
ELECT REM PT RETURN 9FT ADLT (ELECTROSURGICAL) ×4
ELECTRODE REM PT RTRN 9FT ADLT (ELECTROSURGICAL) ×3 IMPLANT
EVACUATOR 1/8 PVC DRAIN (DRAIN) ×4 IMPLANT
EVACUATOR 3/16  PVC DRAIN (DRAIN)
EVACUATOR 3/16 PVC DRAIN (DRAIN) IMPLANT
GLOVE BIO SURGEON STRL SZ7.5 (GLOVE) ×8 IMPLANT
GLOVE BIO SURGEON STRL SZ8 (GLOVE) ×24 IMPLANT
GLOVE BIOGEL PI IND STRL 7.5 (GLOVE) ×3 IMPLANT
GLOVE BIOGEL PI IND STRL 8 (GLOVE) ×6 IMPLANT
GLOVE BIOGEL PI INDICATOR 7.5 (GLOVE) ×1
GLOVE BIOGEL PI INDICATOR 8 (GLOVE) ×2
GLOVE SURG SS PI 7.5 STRL IVOR (GLOVE) ×8 IMPLANT
GLOVE SURG SS PI 8.0 STRL IVOR (GLOVE) ×12 IMPLANT
GOWN PREVENTION PLUS XLARGE (GOWN DISPOSABLE) ×4 IMPLANT
GOWN STRL NON-REIN LRG LVL3 (GOWN DISPOSABLE) ×8 IMPLANT
GOWN STRL REIN 2XL XLG LVL4 (GOWN DISPOSABLE) ×8 IMPLANT
GUIDEPIN 3.2X17.5 THRD DISP (PIN) ×4 IMPLANT
GUIDEWIRE BEAD TIP (WIRE) ×4 IMPLANT
HANDPIECE INTERPULSE COAX TIP (DISPOSABLE) ×1
IMMOBILIZER KNEE 22 UNIV (SOFTGOODS) ×4 IMPLANT
KIT BASIN OR (CUSTOM PROCEDURE TRAY) ×4 IMPLANT
KIT ROOM TURNOVER OR (KITS) ×4 IMPLANT
MANIFOLD NEPTUNE II (INSTRUMENTS) ×4 IMPLANT
NDL SUT .5 MAYO 1.404X.05X (NEEDLE) IMPLANT
NEEDLE 22X1 1/2 (OR ONLY) (NEEDLE) IMPLANT
NEEDLE MAYO TAPER (NEEDLE)
NS IRRIG 1000ML POUR BTL (IV SOLUTION) ×4 IMPLANT
OIC GUIDEPIN 2.8 X 300 THREADED TIP ×12 IMPLANT
PACK GENERAL/GYN (CUSTOM PROCEDURE TRAY) ×4 IMPLANT
PACK ORTHO EXTREMITY (CUSTOM PROCEDURE TRAY) IMPLANT
PAD ARMBOARD 7.5X6 YLW CONV (MISCELLANEOUS) ×4 IMPLANT
PAD CAST 4YDX4 CTTN HI CHSV (CAST SUPPLIES) ×3 IMPLANT
PADDING CAST COTTON 4X4 STRL (CAST SUPPLIES) ×1
PADDING CAST COTTON 6X4 STRL (CAST SUPPLIES) ×4 IMPLANT
SCREW CANN 7.3X75 32MM THRD (Screw) ×4 IMPLANT
SCREW CANN 7.3X80 32MM THRD (Screw) ×4 IMPLANT
SCREW CANN 7.3X85 32MM THRD (Screw) ×4 IMPLANT
SCREW CORT TI DBL LEAD 5X38 (Screw) ×4 IMPLANT
SCREW CORT TI DBL LEAD 5X42 (Screw) ×4 IMPLANT
SCREW CORT TI DBL LEAD 5X70 (Screw) ×4 IMPLANT
SCREW CORT TI DBLE LEAD 5X54 (Screw) ×4 IMPLANT
SET HNDPC FAN SPRY TIP SCT (DISPOSABLE) ×3 IMPLANT
SPONGE GAUZE 4X4 12PLY (GAUZE/BANDAGES/DRESSINGS) ×4 IMPLANT
SPONGE LAP 18X18 X RAY DECT (DISPOSABLE) ×8 IMPLANT
STAPLER VISISTAT 35W (STAPLE) ×4 IMPLANT
STOCKINETTE IMPERVIOUS 9X36 MD (GAUZE/BANDAGES/DRESSINGS) IMPLANT
STOCKINETTE IMPERVIOUS LG (DRAPES) IMPLANT
SUCTION FRAZIER TIP 10 FR DISP (SUCTIONS) ×4 IMPLANT
SUT ETHILON 2 0 FS 18 (SUTURE) ×12 IMPLANT
SUT ETHILON 2 0 PSLX (SUTURE) ×4 IMPLANT
SUT ETHILON 3 0 PS 1 (SUTURE) ×12 IMPLANT
SUT ETHILON O TP 1 (SUTURE) ×4 IMPLANT
SUT FIBERWIRE #2 38 T-5 BLUE (SUTURE) ×16
SUT PDS AB 0 CT 36 (SUTURE) ×8 IMPLANT
SUT PDS AB 1 CT  36 (SUTURE) ×1
SUT PDS AB 1 CT 36 (SUTURE) ×3 IMPLANT
SUT PDS AB 2-0 CT1 27 (SUTURE) ×8 IMPLANT
SUT PROLENE 0 CT (SUTURE) ×4 IMPLANT
SUT PROLENE 0 CT 2 (SUTURE) ×4 IMPLANT
SUT VIC AB 0 CT1 27 (SUTURE)
SUT VIC AB 0 CT1 27XBRD ANBCTR (SUTURE) IMPLANT
SUT VIC AB 1 CT1 27 (SUTURE)
SUT VIC AB 1 CT1 27XBRD ANBCTR (SUTURE) IMPLANT
SUT VIC AB 2-0 CT1 27 (SUTURE)
SUT VIC AB 2-0 CT1 TAPERPNT 27 (SUTURE) IMPLANT
SUTURE FIBERWR #2 38 T-5 BLUE (SUTURE) ×12 IMPLANT
SYR 20ML ECCENTRIC (SYRINGE) IMPLANT
TOWEL OR 17X24 6PK STRL BLUE (TOWEL DISPOSABLE) ×4 IMPLANT
TOWEL OR 17X26 10 PK STRL BLUE (TOWEL DISPOSABLE) ×8 IMPLANT
TRAY FOLEY CATH 14FR (SET/KITS/TRAYS/PACK) IMPLANT
TUBE ANAEROBIC SPECIMEN COL (MISCELLANEOUS) IMPLANT
TUBE CONNECTING 12X1/4 (SUCTIONS) ×4 IMPLANT
UNDERPAD 30X30 INCONTINENT (UNDERPADS AND DIAPERS) ×8 IMPLANT
WATER STERILE IRR 1000ML POUR (IV SOLUTION) IMPLANT
YANKAUER SUCT BULB TIP NO VENT (SUCTIONS) ×8 IMPLANT

## 2012-06-01 NOTE — Progress Notes (Signed)
Subjective: 1 Day Post-Op Procedure(s) (LRB): IRRIGATION AND DEBRIDEMENT EXTREMITY (Left) EXTERNAL FIXATION LEG (Left) Patient reports pain as moderate.  Acute blood loss anemia with hgb 8.1.  Stable vitals.  Objective: Vital signs in last 24 hours: Temp:  [97.7 F (36.5 C)-99.1 F (37.3 C)] 99.1 F (37.3 C) (12/16 0549) Pulse Rate:  [93-116] 116  (12/16 0549) Resp:  [18-23] 18  (12/16 0549) BP: (121-139)/(70-89) 139/79 mmHg (12/16 0549) SpO2:  [99 %-100 %] 99 % (12/16 0549) Weight:  [75.751 kg (167 lb)] 75.751 kg (167 lb) (12/15 2300)  Intake/Output from previous day: 12/15 0701 - 12/16 0700 In: 1870 [P.O.:570; I.V.:1200; IV Piggyback:100] Out: 1355 [Urine:1230; Blood:125] Intake/Output this shift:     Basename 06/01/12 0620 05/31/12 0242 05/31/12 0238  HGB 8.1* 14.0 13.9    Basename 06/01/12 0620 05/31/12 0242  WBC 11.7* 15.4*  RBC 2.71* 4.59  HCT 23.5* 40.6  PLT 137* 193    Basename 05/31/12 0242 05/31/12 0238  NA 142 144  K 3.9 3.5  CL 106 108  CO2 21 --  BUN 12 12  CREATININE 1.21 1.30  GLUCOSE 149* 142*  CALCIUM 8.8 --    Basename 05/31/12 0242  LABPT --  INR 1.05    Sensation intact distally Intact pulses distally Dorsiflexion/Plantar flexion intact Incision: dressing C/D/I  Assessment/Plan: 1 Day Post-Op Procedure(s) (LRB): IRRIGATION AND DEBRIDEMENT EXTREMITY (Left) EXTERNAL FIXATION LEG (Left) Up with therapy, NWB left leg Consult Dr. Carola Frost, Ortho Trauma, for possible surgery today given complexity of injuries Continue NPO for now.  Veneta Sliter Y 06/01/2012, 7:49 AM

## 2012-06-01 NOTE — Progress Notes (Signed)
Patient currently not in his room.  Will see after surgery.  Marta Lamas. Gae Bon, MD, FACS [phone] Trauma Surgeon

## 2012-06-01 NOTE — Anesthesia Postprocedure Evaluation (Signed)
Anesthesia Post Note  Patient: [name]  Procedure(s) Performed: Procedure(s) (LRB): IRRIGATION AND DEBRIDEMENT EXTREMITY (Left) PATELLA TENDON REPAIR (Left) OPEN REDUCTION INTERNAL FIXATION (ORIF) TIBIAL PLATEAU (Left) INTRAMEDULLARY (IM) RETROGRADE FEMORAL NAILING (Left)  Anesthesia type: general  Patient location: PACU  Post pain: Pain level controlled  Post assessment: Patient's Cardiovascular Status Stable  Last Vitals:  Filed Vitals:   06/01/12 2100  BP: 142/83  Pulse: 126  Temp: 37.3 C  Resp: 25    Post vital signs: Reviewed and stable  Level of consciousness: sedated  Complications: No apparent anesthesia complications

## 2012-06-01 NOTE — Brief Op Note (Signed)
05/31/2012 - 06/01/2012  6:41 PM  PATIENT:  [name]  41 y.o. male  PRE-OPERATIVE DIAGNOSIS:  open  left leg  fractures  POST-OPERATIVE DIAGNOSIS:  left femur fracture, open left tibial plateau fracture, torn left patella meniscus  PROCEDURE:  Procedure(s) (LRB) with comments: IRRIGATION AND DEBRIDEMENT EXTREMITY (Left) PATELLA TENDON REPAIR (Left) OPEN REDUCTION INTERNAL FIXATION (ORIF) TIBIAL PLATEAU (Left) INTRAMEDULLARY (IM) RETROGRADE FEMORAL NAILING (Left) [employer] [place] 9x44 Open treatment of patella fracture with excision of unstable osteochondral fragment I&D open tibial plateau frx Removal of external fixator right thigh and left  SURGEON:  Surgeon(s) and Role:    * Budd Palmer, MD - Primary  PHYSICIAN ASSISTANT: Montez Morita, [facility]  ANESTHESIA:   general  EBL:  Total I/O In: 4500 [I.V.:3050; Blood:700; IV Piggyback:750] Out: 1350 [Urine:1150; Blood:200]  DRAINS: Medium hemovac posterior to tendon, penrose superficial to tendon repair   LOCAL MEDICATIONS USED:  NONE  SPECIMEN:  No Specimen  DISPOSITION OF SPECIMEN:  N/A  COUNTS:  YES  TOURNIQUET:  * No tourniquets in log *  DICTATION: .Other Dictation: Dictation Number [id number]  PLAN OF CARE: Admit to inpatient   PATIENT DISPOSITION:  PACU - hemodynamically stable.   Delay start of Pharmacological VTE agent (>24hrs) due to surgical blood loss or risk of bleeding: no

## 2012-06-01 NOTE — Preoperative (Signed)
Beta Blockers   Reason not to administer Beta Blockers:Not Applicable 
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
## 2012-06-01 NOTE — Anesthesia Preprocedure Evaluation (Addendum)
Anesthesia Evaluation  Patient identified by MRN, date of birth, ID band Patient awake    Reviewed: Allergy & Precautions, H&P , NPO status , Patient's Chart, lab work & pertinent test results  History of Anesthesia Complications Negative for: history of anesthetic complications  Airway Mallampati: I TM Distance: >3 FB Neck ROM: full    Dental  (+) Teeth Intact and Dental Advisory Given   Pulmonary neg pulmonary ROS,  breath sounds clear to auscultation  Pulmonary exam normal       Cardiovascular negative cardio ROS  Rhythm:regular     Neuro/Psych  Neuromuscular disease  C-spine not cleared negative psych ROS   GI/Hepatic negative GI ROS, Neg liver ROS,   Endo/Other  negative endocrine ROS  Renal/GU negative Renal ROS     Musculoskeletal negative musculoskeletal ROS (+)   Abdominal   Peds  Hematology negative hematology ROS (+) anemia ,   Anesthesia Other Findings See surgeon's H&P ,multiple fractures  Reproductive/Obstetrics negative OB ROS                          Anesthesia Physical Anesthesia Plan  ASA: I  Anesthesia Plan: General ETT   Post-op Pain Management:    Induction:   Airway Management Planned:   Additional Equipment:   Intra-op Plan:   Post-operative Plan:   Informed Consent:   Plan Discussed with:   Anesthesia Plan Comments:         Anesthesia Quick Evaluation

## 2012-06-01 NOTE — Progress Notes (Signed)
Pt required increased pain medications during the night. He started having muscle spasms in left thigh. Pain mostly relieved with IV Dilaudid.

## 2012-06-01 NOTE — Transfer of Care (Signed)
Immediate Anesthesia Transfer of Care Note  Patient: [name]  Procedure(s) Performed: Procedure(s) (LRB) with comments: IRRIGATION AND DEBRIDEMENT EXTREMITY (Left) PATELLA TENDON REPAIR (Left) OPEN REDUCTION INTERNAL FIXATION (ORIF) TIBIAL PLATEAU (Left) INTRAMEDULLARY (IM) RETROGRADE FEMORAL NAILING (Left)  Patient Location: PACU  Anesthesia Type:General  Level of Consciousness: awake, alert  and oriented  Airway & Oxygen Therapy: Patient Spontanous Breathing and Patient connected to face mask oxygen  Post-op Assessment: Report given to PACU RN and Post -op Vital signs reviewed and stable  Post vital signs: Reviewed and stable  Complications: No apparent anesthesia complications

## 2012-06-01 NOTE — Anesthesia Procedure Notes (Addendum)
Procedure Name: Intubation Date/Time: 06/01/2012 1:29 PM Performed by: Lovie Chol Pre-anesthesia Checklist: Patient identified, Emergency Drugs available, Suction available, Patient being monitored and Timeout performed Patient Re-evaluated:Patient Re-evaluated prior to inductionOxygen Delivery Method: Circle system utilized Preoxygenation: Pre-oxygenation with 100% oxygen Intubation Type: IV induction Ventilation: Mask ventilation without difficulty Laryngoscope Size: Miller and 3 Grade View: Grade I Tube type: Oral Tube size: 8.0 mm Number of attempts: 1 Airway Equipment and Method: Stylet and LTA kit utilized Placement Confirmation: ETT inserted through vocal cords under direct vision,  positive ETCO2,  CO2 detector and breath sounds checked- equal and bilateral Secured at: 22 (teeth) cm Tube secured with: Tape Dental Injury: Teeth and Oropharynx as per pre-operative assessment
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
## 2012-06-02 ENCOUNTER — Encounter (HOSPITAL_COMMUNITY): Payer: Self-pay | Admitting: Orthopedic Surgery

## 2012-06-02 ENCOUNTER — Inpatient Hospital Stay (HOSPITAL_COMMUNITY): Payer: Medicare Other

## 2012-06-02 DIAGNOSIS — S43203A Unspecified subluxation of unspecified sternoclavicular joint, initial encounter: Secondary | ICD-10-CM | POA: Diagnosis present

## 2012-06-02 LAB — BASIC METABOLIC PANEL
BUN: 7 mg/dL (ref 6–23)
Chloride: 102 mEq/L (ref 96–112)
Creatinine, Ser: 1.09 mg/dL (ref 0.50–1.35)
GFR calc Af Amer: >90 mL/min (ref >90)
GFR calc non Af Amer: 83 mL/min — ABNORMAL LOW (ref >90)
Potassium: 3.6 mEq/L (ref 3.5–5.1)

## 2012-06-02 LAB — CBC
MCHC: 34.2 g/dL (ref 30.0–36.0)
RDW: 14.2 % (ref 11.5–15.5)
WBC: 10.1 10*3/uL (ref 4.0–10.5)

## 2012-06-02 MED ORDER — METHOCARBAMOL 500 MG PO TABS: 500.0000 mg | ORAL_TABLET | Freq: Four times a day (QID) | ORAL | Status: DC | PRN

## 2012-06-02 MED ORDER — METHOCARBAMOL 100 MG/ML IJ SOLN
500.0000 mg | Freq: Four times a day (QID) | INTRAVENOUS | Status: DC | PRN
  Administered 2012-06-02: 1000 mg via INTRAVENOUS
  Filled 2012-06-02 (×2): qty 10
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
## 2012-06-02 NOTE — Progress Notes (Signed)
Patient ID: [name], male   DOB: 02/20/1971, 41 y.o.   MRN: [id number] Awake and alert this am.  Moderate pain, but very well tolerated.  Had left femur and tibial plateau fixed yesterday as well as a repair of hi patella tendon by Handy/Paul.  I appreciate their assistance in the treatment of these complex injuries.  His hgb is 7.7, but tolerating this post-traumatic/post-surgical acute blood loss anemia.  Will have talus fracture addressed later this week. ? Of when to resume DVT meds such as lovenox.

## 2012-06-02 NOTE — Progress Notes (Signed)
UR COMPLETED  
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
## 2012-06-02 NOTE — Progress Notes (Signed)
Rehab Admissions Coordinator Note:  Patient was screened by Trish Mage for appropriateness for an Inpatient Acute Rehab Consult. Noted PT recommending inpatient rehab consult.  At this time, we are recommending Inpatient Rehab consult.  Trish Mage 06/02/2012, 1:10 PM  I can be reached at [phone].
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
## 2012-06-02 NOTE — Progress Notes (Signed)
Abdomen benign No flank tenderness Receiving transfusion, Hb up some already this AM Patient examined and I agree with the assessment and plan  Violeta Gelinas, MD, MPH, FACS Pager: [phone]  06/02/2012 9:58 AM

## 2012-06-02 NOTE — Progress Notes (Signed)
Orthopedic Tech Progress Note Patient Details:  [name] Oct 26, 1970 [id number] OHF with Trapeze bar applied to bed Patient ID: [name], male   DOB: [birth year], 41 y.o.   MRN: [id number]   Orie Rout 06/02/2012, 1:47 PM

## 2012-06-03 LAB — TYPE AND SCREEN
ABO/RH(D): O POS
ABO/RH(D): O POS
Antibody Screen: NEGATIVE
Unit division: 0
Unit division: 0

## 2012-06-03 LAB — CBC
MCH: 30 pg (ref 26.0–34.0)
MCHC: 35.4 g/dL (ref 30.0–36.0)
Platelets: 97 10*3/uL — ABNORMAL LOW (ref 150–400)

## 2012-06-03 LAB — BASIC METABOLIC PANEL
BUN: 5 mg/dL — ABNORMAL LOW (ref 6–23)
Calcium: 8.4 mg/dL (ref 8.4–10.5)
GFR calc non Af Amer: >90 mL/min (ref >90)
Glucose, Bld: 129 mg/dL — ABNORMAL HIGH (ref 70–99)

## 2012-06-03 MED ORDER — CEFAZOLIN SODIUM-DEXTROSE 2-3 GM-% IV SOLR
2.0000 g | Freq: Once | INTRAVENOUS | Status: DC
  Filled 2012-06-03: qty 50
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
## 2012-06-04 ENCOUNTER — Inpatient Hospital Stay (HOSPITAL_COMMUNITY): Payer: Medicare Other | Admitting: Anesthesiology

## 2012-06-04 ENCOUNTER — Encounter (HOSPITAL_COMMUNITY): Payer: Self-pay | Admitting: Anesthesiology

## 2012-06-04 ENCOUNTER — Inpatient Hospital Stay (HOSPITAL_COMMUNITY): Payer: Medicare Other

## 2012-06-04 ENCOUNTER — Encounter (HOSPITAL_COMMUNITY)
Admission: EM | Disposition: A | Discharge status: Home / Self Care | Payer: Self-pay | Source: Home / Self Care | Attending: Orthopaedic Surgery

## 2012-06-04 HISTORY — PX: ORIF ANKLE FRACTURE: SHX5408

## 2012-06-04 SURGERY — OPEN REDUCTION INTERNAL FIXATION (ORIF) ANKLE FRACTURE
Anesthesia: General | Site: Foot | Laterality: Left | Wound class: Clean

## 2012-06-04 MED ORDER — LACTATED RINGERS IV SOLN
INTRAVENOUS | Status: DC | PRN
  Administered 2012-06-04 (×2): via INTRAVENOUS

## 2012-06-04 MED ORDER — CEFAZOLIN SODIUM 1-5 GM-% IV SOLN
1.0000 g | Freq: Three times a day (TID) | INTRAVENOUS | Status: AC
  Administered 2012-06-04 – 2012-06-05 (×3): 1 g via INTRAVENOUS
  Filled 2012-06-04 (×3): qty 50

## 2012-06-04 MED ORDER — PROPOFOL 10 MG/ML IV BOLUS
INTRAVENOUS | Status: DC | PRN
  Administered 2012-06-04: 200 mg via INTRAVENOUS

## 2012-06-04 MED ORDER — MIDAZOLAM HCL 2 MG/2ML IJ SOLN: 1.0000 mg | INTRAMUSCULAR | Status: DC | PRN

## 2012-06-04 MED ORDER — PROMETHAZINE HCL 25 MG/ML IJ SOLN: 6.2500 mg | INTRAMUSCULAR | Status: DC | PRN

## 2012-06-04 MED ORDER — OXYCODONE HCL 5 MG/5ML PO SOLN: 5.0000 mg | Freq: Once | ORAL | Status: DC | PRN

## 2012-06-04 MED ORDER — FENTANYL CITRATE 0.05 MG/ML IJ SOLN
INTRAMUSCULAR | Status: DC | PRN
  Administered 2012-06-04: 100 ug via INTRAVENOUS
  Administered 2012-06-04: 50 ug via INTRAVENOUS
  Administered 2012-06-04: 100 ug via INTRAVENOUS
  Administered 2012-06-04 (×2): 50 ug via INTRAVENOUS

## 2012-06-04 MED ORDER — FENTANYL CITRATE 0.05 MG/ML IJ SOLN: 50.0000 ug | Freq: Once | INTRAMUSCULAR | Status: DC

## 2012-06-04 MED ORDER — 0.9 % SODIUM CHLORIDE (POUR BTL) OPTIME
TOPICAL | Status: DC | PRN
  Administered 2012-06-04: 1000 mL

## 2012-06-04 MED ORDER — MIDAZOLAM HCL 5 MG/5ML IJ SOLN
INTRAMUSCULAR | Status: DC | PRN
  Administered 2012-06-04: 2 mg via INTRAVENOUS

## 2012-06-04 MED ORDER — ACETAMINOPHEN 10 MG/ML IV SOLN
1000.0000 mg | Freq: Four times a day (QID) | INTRAVENOUS | Status: AC
  Administered 2012-06-04 – 2012-06-05 (×4): 1000 mg via INTRAVENOUS
  Filled 2012-06-04 (×4): qty 100

## 2012-06-04 MED ORDER — LIDOCAINE HCL (CARDIAC) 20 MG/ML IV SOLN
INTRAVENOUS | Status: DC | PRN
  Administered 2012-06-04: 100 mg via INTRAVENOUS

## 2012-06-04 MED ORDER — HYDROMORPHONE HCL PF 1 MG/ML IJ SOLN: 0.2500 mg | INTRAMUSCULAR | Status: DC | PRN

## 2012-06-04 MED ORDER — OXYCODONE HCL 5 MG PO TABS: 5.0000 mg | ORAL_TABLET | Freq: Once | ORAL | Status: DC | PRN

## 2012-06-04 MED ORDER — ROCURONIUM BROMIDE 100 MG/10ML IV SOLN
INTRAVENOUS | Status: DC | PRN
  Administered 2012-06-04: 50 mg via INTRAVENOUS

## 2012-06-04 SURGICAL SUPPLY — 74 items
BANDAGE ELASTIC 4 VELCRO ST LF (GAUZE/BANDAGES/DRESSINGS) ×2 IMPLANT
BANDAGE ELASTIC 6 VELCRO ST LF (GAUZE/BANDAGES/DRESSINGS) ×2 IMPLANT
BANDAGE ESMARK 6X9 LF (GAUZE/BANDAGES/DRESSINGS) ×1 IMPLANT
BANDAGE GAUZE ELAST BULKY 4 IN (GAUZE/BANDAGES/DRESSINGS) ×4 IMPLANT
BIT DRILL 100X2XQC STRL (BIT) ×1 IMPLANT
BIT DRILL 110MM 85MM (BIT) ×1 IMPLANT
BIT DRILL QC 2.0X100 (BIT) ×1
BIT DRL 100X2XQC STRL (BIT) ×1
BLADE SURG 10 STRL SS (BLADE) ×2 IMPLANT
BNDG COHESIVE 4X5 TAN STRL (GAUZE/BANDAGES/DRESSINGS) ×2 IMPLANT
BNDG ESMARK 6X9 LF (GAUZE/BANDAGES/DRESSINGS) ×2
BRUSH SCRUB DISP (MISCELLANEOUS) ×4 IMPLANT
CLOTH BEACON ORANGE TIMEOUT ST (SAFETY) ×2 IMPLANT
COVER SURGICAL LIGHT HANDLE (MISCELLANEOUS) ×4 IMPLANT
DRAPE C-ARM 42X72 X-RAY (DRAPES) ×2 IMPLANT
DRAPE C-ARM MINI 42X72 WSTRAPS (DRAPES) ×2 IMPLANT
DRAPE C-ARMOR (DRAPES) ×2 IMPLANT
DRAPE EXTREMITY T 121X128X90 (DRAPE) ×2 IMPLANT
DRAPE ORTHO SPLIT 77X108 STRL (DRAPES)
DRAPE PROXIMA HALF (DRAPES) ×2 IMPLANT
DRAPE SURG ORHT 6 SPLT 77X108 (DRAPES) IMPLANT
DRAPE U-SHAPE 47X51 STRL (DRAPES) ×2 IMPLANT
DRILL BIT 110MM/85MM (BIT) ×1
DRSG EMULSION OIL 3X3 NADH (GAUZE/BANDAGES/DRESSINGS) ×4 IMPLANT
DRSG MEPILEX BORDER 4X12 (GAUZE/BANDAGES/DRESSINGS) ×2 IMPLANT
DRSG MEPILEX BORDER 4X4 (GAUZE/BANDAGES/DRESSINGS) ×2 IMPLANT
DRSG MEPILEX BORDER 4X8 (GAUZE/BANDAGES/DRESSINGS) ×2 IMPLANT
ELECT REM PT RETURN 9FT ADLT (ELECTROSURGICAL)
ELECTRODE REM PT RTRN 9FT ADLT (ELECTROSURGICAL) IMPLANT
GLOVE BIO SURGEON STRL SZ7.5 (GLOVE) ×2 IMPLANT
GLOVE BIO SURGEON STRL SZ8 (GLOVE) ×2 IMPLANT
GLOVE BIOGEL PI IND STRL 7.5 (GLOVE) ×1 IMPLANT
GLOVE BIOGEL PI IND STRL 8 (GLOVE) ×1 IMPLANT
GLOVE BIOGEL PI INDICATOR 7.5 (GLOVE) ×1
GLOVE BIOGEL PI INDICATOR 8 (GLOVE) ×1
GOWN PREVENTION PLUS XLARGE (GOWN DISPOSABLE) ×2 IMPLANT
GOWN STRL NON-REIN LRG LVL3 (GOWN DISPOSABLE) ×4 IMPLANT
K-WIRE 1.25 TRCR POINT 150 (WIRE) ×2
K-WIRE 1.6X150 (WIRE) ×4
KIT BASIN OR (CUSTOM PROCEDURE TRAY) ×2 IMPLANT
KIT ROOM TURNOVER OR (KITS) ×2 IMPLANT
KWIRE 1.25 TRCR POINT 150 (WIRE) ×1 IMPLANT
KWIRE 1.6X150 (WIRE) ×2 IMPLANT
MANIFOLD NEPTUNE II (INSTRUMENTS) ×2 IMPLANT
NEEDLE HYPO 21X1.5 SAFETY (NEEDLE) IMPLANT
NS IRRIG 1000ML POUR BTL (IV SOLUTION) ×2 IMPLANT
PACK GENERAL/GYN (CUSTOM PROCEDURE TRAY) ×2 IMPLANT
PAD ARMBOARD 7.5X6 YLW CONV (MISCELLANEOUS) ×4 IMPLANT
PAD CAST 4YDX4 CTTN HI CHSV (CAST SUPPLIES) ×1 IMPLANT
PADDING CAST COTTON 4X4 STRL (CAST SUPPLIES) ×1
PADDING CAST COTTON 6X4 STRL (CAST SUPPLIES) ×4 IMPLANT
PENCIL BUTTON HOLSTER BLD 10FT (ELECTRODE) ×2 IMPLANT
PLATE CONDYLAR 2.0 7H 39M L (Plate) ×2 IMPLANT
SCREW CORTEX 2.0 30MM (Screw) ×2 IMPLANT
SCREW CORTEX 2.0 34MM (Screw) ×4 IMPLANT
SCREW CORTEX 2.0 36MM (Screw) ×4 IMPLANT
SCREW CORTEX 2.0 38MM (Screw) ×4 IMPLANT
SPONGE GAUZE 4X4 12PLY (GAUZE/BANDAGES/DRESSINGS) ×2 IMPLANT
SPONGE LAP 18X18 X RAY DECT (DISPOSABLE) IMPLANT
SPONGE SCRUB IODOPHOR (GAUZE/BANDAGES/DRESSINGS) ×2 IMPLANT
STAPLER VISISTAT 35W (STAPLE) IMPLANT
SUCTION FRAZIER TIP 10 FR DISP (SUCTIONS) ×2 IMPLANT
SUT ETHILON 2 0 FS 18 (SUTURE) IMPLANT
SUT ETHILON 3 0 PS 1 (SUTURE) ×2 IMPLANT
SUT PDS AB 2-0 CT1 27 (SUTURE) IMPLANT
SUT VIC AB 2-0 CT1 27 (SUTURE) ×2
SUT VIC AB 2-0 CT1 TAPERPNT 27 (SUTURE) ×2 IMPLANT
SUT VIC AB 2-0 CT3 27 (SUTURE) ×2 IMPLANT
SYR CONTROL 10ML LL (SYRINGE) IMPLANT
TOWEL OR 17X24 6PK STRL BLUE (TOWEL DISPOSABLE) ×2 IMPLANT
TOWEL OR 17X26 10 PK STRL BLUE (TOWEL DISPOSABLE) ×4 IMPLANT
TUBE CONNECTING 12X1/4 (SUCTIONS) ×2 IMPLANT
UNDERPAD 30X30 INCONTINENT (UNDERPADS AND DIAPERS) ×2 IMPLANT
WATER STERILE IRR 1000ML POUR (IV SOLUTION) ×2 IMPLANT

## 2012-06-04 NOTE — Brief Op Note (Addendum)
05/31/2012 - 06/04/2012  10:44 AM  PATIENT:  [name]  41 y.o. male  PRE-OPERATIVE DIAGNOSIS:  Left Talus Fracture, left calf wound  POST-OPERATIVE DIAGNOSIS:  Left Talus Fracture, left calf wound  PROCEDURE:  Procedure(s) (LRB) with comments: OPEN REDUCTION INTERNAL FIXATION (ORIF) ANKLE FRACTURE (Left) - ORIF Talus Fracture Retention suture closure of left calf wound 8cm  SURGEON:  Surgeon(s) and Role:    * Budd Palmer, MD - Primary  PHYSICIAN ASSISTANT: Montez Morita, [facility]  ANESTHESIA:   general  EBL:  Total I/O In: 1000 [I.V.:1000] Out: -   BLOOD ADMINISTERED:none  DRAINS: none   LOCAL MEDICATIONS USED:  NONE  SPECIMEN:  No Specimen  DISPOSITION OF SPECIMEN:  N/A  COUNTS:  YES  TOURNIQUET:  * No tourniquets in log *  DICTATION: .Other Dictation: Dictation Number [id number]  PLAN OF CARE: Admit to inpatient   PATIENT DISPOSITION:  PACU - hemodynamically stable.   Delay start of Pharmacological VTE agent (>24hrs) due to surgical blood loss or risk of bleeding: no

## 2012-06-04 NOTE — Progress Notes (Signed)
PT Cancellation Note  Patient Details Name: Bill Yohn MRN: [id number] DOB: Jan 20, 1971   Cancelled Treatment:    Reason Eval/Treat Not Completed: Patient at procedure or test/unavailable. Patient in surgery this morning. Will have PT follow up and reassess tomorrow. Thanks! 06/04/2012 Fredrich Birks PTA [phone] pager [phone] office      Fredrich Birks 06/04/2012, 7:24 AM
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
## 2012-06-04 NOTE — Anesthesia Postprocedure Evaluation (Signed)
  Anesthesia Post-op Note  Patient: [name]  Procedure(s) Performed: Procedure(s) (LRB) with comments: OPEN REDUCTION INTERNAL FIXATION (ORIF) ANKLE FRACTURE (Left) - ORIF Talus Fracture  Patient Location: PACU  Anesthesia Type:General  Level of Consciousness: awake and alert   Airway and Oxygen Therapy: Patient Spontanous Breathing  Post-op Pain: mild  Post-op Assessment: Post-op Vital signs reviewed, Patient's Cardiovascular Status Stable, Respiratory Function Stable, Patent Airway, No signs of Nausea or vomiting and Pain level controlled  Post-op Vital Signs: stable  Complications: No apparent anesthesia complications

## 2012-06-04 NOTE — Transfer of Care (Signed)
Immediate Anesthesia Transfer of Care Note  Patient: [name]  Procedure(s) Performed: Procedure(s) (LRB) with comments: OPEN REDUCTION INTERNAL FIXATION (ORIF) ANKLE FRACTURE (Left) - ORIF Talus Fracture  Patient Location: PACU  Anesthesia Type:General  Level of Consciousness: awake, alert  and oriented  Airway & Oxygen Therapy: Patient Spontanous Breathing and Patient connected to face mask oxygen  Post-op Assessment: Report given to PACU RN, Post -op Vital signs reviewed and stable and Patient moving all extremities  Post vital signs: Reviewed and stable  Complications: No apparent anesthesia complications

## 2012-06-04 NOTE — Care Management Note (Unsigned)
    Page 1 of 2   06/04/2012     4:45:48 PM   CARE MANAGEMENT NOTE 06/04/2012  Patient:  [name], [name]   Account Number:  [id number]  Date Initiated:  06/04/2012  Documentation initiated by:  Letha Cape  Subjective/Objective Assessment:   dx  admit- lives with spouse.     Action/Plan:   pt eval- hhpt/ot   Anticipated DC Date:  06/05/2012   Anticipated DC Plan:  HOME W HOME HEALTH SERVICES      DC Planning Services  CM consult      [facility] Choice  HOME HEALTH   Choice offered to / List presented to:  C-1 Patient   DME arranged  3-N-1  Levan Hurst      DME agency  [facility]     HH arranged  HH-2 PT  HH-3 OT      [facility] agency  [facility]   Status of service:  In process, will continue to follow Medicare Important Message given?   (If response is "NO", the following Medicare IM given date fields will be blank) Date Medicare IM given:   Date Additional Medicare IM given:    Discharge Disposition:    Per UR Regulation:    If discussed at Long Length of Stay Meetings, dates discussed:    Comments:  06/04/12 16:43 Letha Cape RN, BSN [phone] patient lives with wife at home, patient chose [facility] first from the agency list, then she had the RN call me back to tell me she wanted to change to [facility],  referral made to [facility]  for hhpt/ot and rolling walker and  3 n 1.  Soc will begin 24-48 hrs post discharge.

## 2012-06-04 NOTE — Preoperative (Signed)
Beta Blockers   Reason not to administer Beta Blockers:Not Applicable 

## 2012-06-04 NOTE — Progress Notes (Signed)
I saw and examined the patient with Mr. Paul, communicating the findings and plan noted above.  Romie Tay, MD [facility] [phone] (p)  

## 2012-06-04 NOTE — Anesthesia Preprocedure Evaluation (Addendum)
Anesthesia Evaluation  Patient identified by MRN, date of birth, ID band Patient awake    Reviewed: Allergy & Precautions, H&P , NPO status , Patient's Chart, lab work & pertinent test results  History of Anesthesia Complications Negative for: history of anesthetic complications  Airway Mallampati: I TM Distance: >3 FB Neck ROM: full    Dental  (+) Teeth Intact and Dental Advisory Given   Pulmonary neg pulmonary ROS,  breath sounds clear to auscultation  Pulmonary exam normal       Cardiovascular negative cardio ROS  Rhythm:regular Rate:Normal     Neuro/Psych  Neuromuscular disease negative psych ROS   GI/Hepatic negative GI ROS, Neg liver ROS,   Endo/Other  negative endocrine ROS  Renal/GU negative Renal ROS     Musculoskeletal negative musculoskeletal ROS (+)   Abdominal   Peds  Hematology negative hematology ROS (+)   Anesthesia Other Findings See surgeon's H&P ,multiple fractures  Reproductive/Obstetrics negative OB ROS                           Anesthesia Physical Anesthesia Plan  ASA: II  Anesthesia Plan: General   Post-op Pain Management:    Induction: Intravenous  Airway Management Planned: Oral ETT  Additional Equipment:   Intra-op Plan:   Post-operative Plan: Extubation in OR  Informed Consent: I have reviewed the patients History and Physical, chart, labs and discussed the procedure including the risks, benefits and alternatives for the proposed anesthesia with the patient or authorized representative who has indicated his/her understanding and acceptance.     Plan Discussed with: CRNA and Surgeon  Anesthesia Plan Comments:        Anesthesia Quick Evaluation

## 2012-06-04 NOTE — Anesthesia Procedure Notes (Signed)
Procedure Name: Intubation Date/Time: 06/04/2012 8:18 AM Performed by: Rossie Muskrat L Pre-anesthesia Checklist: Patient identified, Timeout performed, Emergency Drugs available, Suction available and Patient being monitored Patient Re-evaluated:Patient Re-evaluated prior to inductionOxygen Delivery Method: Circle system utilized Preoxygenation: Pre-oxygenation with 100% oxygen Intubation Type: IV induction Ventilation: Mask ventilation without difficulty Laryngoscope Size: Mac and 3 Grade View: Grade I Tube type: Oral Tube size: 7.5 mm Number of attempts: 1 (Intubation done by Arlys John, [facility] provider) Airway Equipment and Method: Stylet Placement Confirmation: ETT inserted through vocal cords under direct vision,  breath sounds checked- equal and bilateral and positive ETCO2 Secured at: 23 cm Tube secured with: Tape Dental Injury: Teeth and Oropharynx as per pre-operative assessment

## 2012-06-05 LAB — CBC
MCH: 28.9 pg (ref 26.0–34.0)
MCV: 86.5 fL (ref 78.0–100.0)
Platelets: 186 10*3/uL (ref 150–400)
RDW: 14.3 % (ref 11.5–15.5)

## 2012-06-05 LAB — BASIC METABOLIC PANEL
Calcium: 8.8 mg/dL (ref 8.4–10.5)
Creatinine, Ser: 0.97 mg/dL (ref 0.50–1.35)
GFR calc Af Amer: >90 mL/min (ref >90)

## 2012-06-05 MED ORDER — ACETAMINOPHEN 325 MG PO TABS
325.0000 mg | ORAL_TABLET | Freq: Four times a day (QID) | ORAL | Status: DC | PRN
  Administered 2012-06-06: 650 mg via ORAL
  Filled 2012-06-05: qty 2

## 2012-06-05 MED ORDER — OXYCODONE-ACETAMINOPHEN 5-325 MG PO TABS
1.0000 | ORAL_TABLET | Freq: Four times a day (QID) | ORAL | Status: DC | PRN
  Administered 2012-06-06: 1 via ORAL
  Filled 2012-06-05: qty 1

## 2012-06-05 NOTE — Progress Notes (Signed)
Physical Therapy Treatment Patient Details Name: [name] MRN: [id number] DOB: 1971-01-25 Today's Date: 06/05/2012 Time: 4098-1191 PT Time Calculation (min): 13 min  PT Assessment / Plan / Recommendation Comments on Treatment Session  Pt demonstrates safety and modified independence with mobility NWB on L LE . Pt plans to d/c to his sisters home in [place].  No stairs to negotiate there.      Follow Up Recommendations  No PT follow up     Equipment Recommendations  Rolling walker with 5" wheels;Wheelchair (measurements PT);Wheelchair cushion (measurements PT)    Recommendations for Other Services    Frequency     Plan Discharge plan remains appropriate;Frequency remains appropriate    Precautions / Restrictions Precautions Precautions: Fall Required Braces or Orthoses: Knee Immobilizer - Left Knee Immobilizer - Left: On at all times Restrictions Weight Bearing Restrictions: Yes LLE Weight Bearing: Non weight bearing Hinged knee brace locked at full extension. No active knee extension.    Pertinent Vitals/Pain 3/10 pain in L knee    Mobility  Bed Mobility Bed Mobility: Supine to Sit;Sitting - Scoot to Edge of Bed;Sit to Supine Supine to Sit: 6: Modified independent (Device/Increase time);HOB flat Sitting - Scoot to Edge of Bed: 6: Modified independent (Device/Increase time) Sit to Supine: 6: Modified independent (Device/Increase time);HOB flat Details for Bed Mobility Assistance: Pt uses UE to assist L LE Transfers Transfers: Sit to Stand;Stand to Sit Sit to Stand: 6: Modified independent (Device/Increase time);From bed Stand to Sit: 6: Modified independent (Device/Increase time);To bed Details for Transfer Assistance: No assist or instruction required.  Ambulation/Gait Ambulation/Gait Assistance: 6: Modified independent (Device/Increase time) Ambulation Distance (Feet): 30 Feet Assistive device: Rolling walker Gait Pattern: Step-to pattern Stairs: No Wheelchair  Mobility Wheelchair Mobility: No    Exercises Other Exercises Other Exercises: Pt instructed in prone passive technique.    PT Diagnosis:    PT Problem List:   PT Treatment Interventions:     PT Goals Acute Rehab PT Goals PT Goal Formulation: With patient Time For Goal Achievement: 06/16/12 Potential to Achieve Goals: Good Pt will go Supine/Side to Sit: with modified independence PT Goal: Supine/Side to Sit - Progress: Met Pt will go Sit to Supine/Side: with modified independence PT Goal: Sit to Supine/Side - Progress: Met Pt will go Sit to Stand: with modified independence PT Goal: Sit to Stand - Progress: Met Pt will go Stand to Sit: with modified independence PT Goal: Stand to Sit - Progress: Met Pt will Ambulate: 51 - 150 feet;with modified independence;with rolling walker PT Goal: Ambulate - Progress: Partly met Pt will Go Up / Down Stairs: 3-5 stairs;with min assist;with least restrictive assistive device PT Goal: Up/Down Stairs - Progress: Discontinued (comment) (no longer applicable )  Visit Information  Last PT Received On: 06/05/12 Assistance Needed: +1    Subjective Data  Subjective: I am doing alright   Cognition  Overall Cognitive Status: Appears within functional limits for tasks assessed/performed Arousal/Alertness: Awake/alert Orientation Level: Appears intact for tasks assessed Behavior During Session: [facility] for tasks performed    Balance     End of Session PT - End of Session Equipment Utilized During Treatment: Gait belt;Left knee immobilizer Activity Tolerance: Patient tolerated treatment well Patient left: in bed;with call bell/phone within reach;with bed alarm set;with family/visitor present Nurse Communication: Mobility status   GP     Dayvion Sans 06/05/2012, 4:26 PM Kennith Morss L. Chasyn Cinque DPT [phone]

## 2012-06-05 NOTE — Progress Notes (Signed)
Orthopedic Tech Progress Note Patient Details:  [name] 07/09/1970 [id number]  Patient ID: [name], male   DOB: 02/02/1971, 41 y.o.   MRN: [id number]   Shawnie Pons 06/05/2012, 12:54 PM CALLED JEFF [name] FROM [facility] FOR LEFT HINGED KNEE BRACE LOCKED IN FULL EXTENSION.

## 2012-06-05 NOTE — Progress Notes (Signed)
I have seen and examined the patient. I agree with the findings above. Dressing changed, will continue daily.  Budd Palmer, MD 06/05/2012 12:06 PM

## 2012-06-05 NOTE — Progress Notes (Signed)
Occupational Therapy Treatment Patient Details Name: [name] MRN: [id number] DOB: [birth year] Today's Date: 06/05/2012 Time: 4098-1191 OT Time Calculation (min): 41 min  OT Assessment / Plan / Recommendation Comments on Treatment Session Pt progressing well with OT and at adequate level for d/c home. pt is very anxious about leaving to d/c home with mom and sister however states that "this is the best option" Pt would prefer to stay in [place] area to keep the same staff for follow up appointments . Pt plans to d/c home mothers house in [place]    Follow Up Recommendations  Home health OT    Barriers to Discharge       Equipment Recommendations  3 in 1 bedside comode;Wheelchair (measurements OT);Wheelchair cushion (measurements OT)    Recommendations for Other Services    Frequency Min 2X/week   Plan Discharge plan needs to be updated    Precautions / Restrictions Precautions Precautions: Fall Required Braces or Orthoses: Knee Immobilizer - Left Knee Immobilizer - Left: On at all times Restrictions LLE Weight Bearing: Non weight bearing   Pertinent Vitals/Pain No pain reported Tech arrived to apply hinge brace    ADL  Eating/Feeding: Independent Where Assessed - Eating/Feeding: Bed level Grooming: Wash/dry hands;Wash/dry face;Teeth care;Modified independent Where Assessed - Grooming: Unsupported standing (Lt UE on RW) Upper Body Bathing: Chest;Right arm;Left arm;Abdomen;Modified independent Where Assessed - Upper Body Bathing: Unsupported sit to stand Lower Body Bathing: Modified independent Where Assessed - Lower Body Bathing: Unsupported sit to stand Upper Body Dressing: Modified independent Where Assessed - Upper Body Dressing: Unsupported sitting Toilet Transfer: Modified independent Toilet Transfer Method: Sit to stand Toilet Transfer Equipment: Raised toilet seat with arms (or 3-in-1 over toilet) Toileting - Clothing Manipulation and Hygiene: Modified  independent Where Assessed - Toileting Clothing Manipulation and Hygiene: Sit to stand from 3-in-1 or toilet Equipment Used: Rolling walker;Gait belt Transfers/Ambulation Related to ADLs: Pt with correct hand placement RW Supervision level ADL Comments: pt requesting assistance to restroom on arrival . Pt completed bed mobility holding KI to move Lt LE.     OT Diagnosis:    OT Problem List:   OT Treatment Interventions:     OT Goals Acute Rehab OT Goals OT Goal Formulation: With patient/family Time For Goal Achievement: 06/16/12 Potential to Achieve Goals: Good ADL Goals Pt Will Perform Lower Body Bathing: with supervision;Sitting, chair;with adaptive equipment ADL Goal: Lower Body Bathing - Progress: Met Pt Will Transfer to Toilet: with min assist;Ambulation;3-in-1 ADL Goal: Toilet Transfer - Progress: Met Pt Will Perform Toileting - Clothing Manipulation: with min assist;Sitting on 3-in-1 or toilet ADL Goal: Toileting - Clothing Manipulation - Progress: Met Pt Will Perform Toileting - Hygiene: with min assist;Sit to stand from 3-in-1/toilet ADL Goal: Toileting - Hygiene - Progress: Met Miscellaneous OT Goals Miscellaneous OT Goal #1: Pt will complete bed mobility Supervision as precursor to adls OT Goal: Miscellaneous Goal #1 - Progress: Met  Visit Information  Last OT Received On: 06/05/12 Assistance Needed: +1    Subjective Data      Prior Functioning       Cognition  Overall Cognitive Status: Appears within functional limits for tasks assessed/performed Arousal/Alertness: Awake/alert Orientation Level: Appears intact for tasks assessed Behavior During Session: [facility] for tasks performed    Mobility  Shoulder Instructions Bed Mobility Supine to Sit: 5: Supervision Sitting - Scoot to Edge of Bed: 5: Supervision Transfers Sit to Stand: 5: Supervision Stand to Sit: 5: Supervision Details for Transfer Assistance: wfl no (A) or  cues needed       Exercises       Balance     End of Session OT - End of Session Activity Tolerance: Patient tolerated treatment well Nurse Communication: Mobility status;Precautions  GO     Lucile Shutters 06/05/2012, 2:38 PM Pager: [phone]
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
## 2012-06-06 LAB — CBC
MCV: 88.3 fL (ref 78.0–100.0)
Platelets: 235 10*3/uL (ref 150–400)
RDW: 14.5 % (ref 11.5–15.5)
WBC: 12.1 10*3/uL — ABNORMAL HIGH (ref 4.0–10.5)

## 2012-06-06 MED ORDER — ACETAMINOPHEN 325 MG PO TABS: 325.0000 mg | ORAL_TABLET | Freq: Four times a day (QID) | ORAL | Status: DC | PRN

## 2012-06-06 MED ORDER — OXYCODONE HCL 5 MG PO TABS: 5.0000 mg | ORAL_TABLET | ORAL | Status: DC | PRN

## 2012-06-06 MED ORDER — FERROUS SULFATE 325 (65 FE) MG PO TABS: 325.0000 mg | ORAL_TABLET | Freq: Three times a day (TID) | ORAL | Status: DC

## 2012-06-06 MED ORDER — METHOCARBAMOL 500 MG PO TABS: 500.0000 mg | ORAL_TABLET | Freq: Four times a day (QID) | ORAL | Status: DC | PRN

## 2012-06-06 MED ORDER — ASPIRIN EC 325 MG PO TBEC: 325.0000 mg | DELAYED_RELEASE_TABLET | Freq: Every day | ORAL | Status: DC

## 2012-06-06 MED ORDER — OXYCODONE-ACETAMINOPHEN 5-325 MG PO TABS: 1.0000 | ORAL_TABLET | Freq: Four times a day (QID) | ORAL | Status: DC | PRN

## 2012-06-06 MED ORDER — DSS 100 MG PO CAPS: 100.0000 mg | ORAL_CAPSULE | Freq: Two times a day (BID) | ORAL | Status: DC
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
## 2012-06-06 NOTE — Progress Notes (Signed)
Orthopedic Tech Progress Note Patient Details:  Carliss Quast 03/26/1971 [id number] Brace order completed Patient ID: Dyshaun Bonzo, male   DOB: [birth year], 41 y.o.   MRN: [id number]   Orie Rout 06/06/2012, 8:12 AM

## 2012-06-06 NOTE — Progress Notes (Signed)
Patient d/c'd home with family.  To stay at sister's ranch house in [place].  DME (3-in-1 commode and rolling walker) delivered to pt room by [facility].  HH PT and OT arranged by C.M.  Dressings to left thigh changed per MD order.  Sutures to small incision sites well approximated with minimal serosanguinous drainage.  No s/s of infection.  Pain well controlled with oral meds.  Locked B[place] brace in place and ace to left leg CDI;  Dressing to left arm wound changed. Wound cleansed with NS.  Pt and wife verbalized understanding of all d/c instructions.

## 2012-06-08 ENCOUNTER — Encounter (HOSPITAL_COMMUNITY): Payer: Self-pay | Admitting: Orthopedic Surgery

## 2012-06-15 NOTE — Consult Note (Signed)
I have seen and examined the patient. I agree with the findings above.  Timmothy Baranowski H, MD  

## 2015-03-24 DIAGNOSIS — J019 Acute sinusitis, unspecified: Secondary | ICD-10-CM | POA: Diagnosis not present

## 2015-05-05 DIAGNOSIS — H6122 Impacted cerumen, left ear: Secondary | ICD-10-CM | POA: Diagnosis not present

## 2015-05-05 DIAGNOSIS — I10 Essential (primary) hypertension: Secondary | ICD-10-CM | POA: Diagnosis not present

## 2015-05-05 DIAGNOSIS — J309 Allergic rhinitis, unspecified: Secondary | ICD-10-CM | POA: Diagnosis not present

## 2015-06-07 DIAGNOSIS — J329 Chronic sinusitis, unspecified: Secondary | ICD-10-CM | POA: Diagnosis not present

## 2015-06-07 DIAGNOSIS — J309 Allergic rhinitis, unspecified: Secondary | ICD-10-CM | POA: Diagnosis not present

## 2015-07-19 DIAGNOSIS — Z0000 Encounter for general adult medical examination without abnormal findings: Secondary | ICD-10-CM | POA: Diagnosis not present

## 2015-08-18 DIAGNOSIS — R05 Cough: Secondary | ICD-10-CM | POA: Diagnosis not present

## 2015-08-18 DIAGNOSIS — J329 Chronic sinusitis, unspecified: Secondary | ICD-10-CM | POA: Diagnosis not present

## 2015-08-18 DIAGNOSIS — I10 Essential (primary) hypertension: Secondary | ICD-10-CM | POA: Diagnosis not present

## 2015-09-18 DIAGNOSIS — Z23 Encounter for immunization: Secondary | ICD-10-CM | POA: Diagnosis not present

## 2015-09-18 DIAGNOSIS — J309 Allergic rhinitis, unspecified: Secondary | ICD-10-CM | POA: Diagnosis not present

## 2015-09-18 DIAGNOSIS — I10 Essential (primary) hypertension: Secondary | ICD-10-CM | POA: Diagnosis not present

## 2015-12-04 ENCOUNTER — Encounter (HOSPITAL_BASED_OUTPATIENT_CLINIC_OR_DEPARTMENT_OTHER): Payer: Self-pay | Admitting: Emergency Medicine

## 2015-12-04 ENCOUNTER — Emergency Department (HOSPITAL_BASED_OUTPATIENT_CLINIC_OR_DEPARTMENT_OTHER): Payer: 59

## 2015-12-04 ENCOUNTER — Emergency Department (HOSPITAL_BASED_OUTPATIENT_CLINIC_OR_DEPARTMENT_OTHER)
Admission: EM | Admit: 2015-12-04 | Discharge: 2015-12-04 | Disposition: A | Discharge status: Home / Self Care | Payer: 59 | Attending: Emergency Medicine | Admitting: Emergency Medicine

## 2015-12-04 DIAGNOSIS — M542 Cervicalgia: Secondary | ICD-10-CM | POA: Diagnosis not present

## 2015-12-04 DIAGNOSIS — M25511 Pain in right shoulder: Secondary | ICD-10-CM | POA: Insufficient documentation

## 2015-12-04 DIAGNOSIS — M79641 Pain in right hand: Secondary | ICD-10-CM

## 2015-12-04 DIAGNOSIS — Y92410 Unspecified street and highway as the place of occurrence of the external cause: Secondary | ICD-10-CM | POA: Insufficient documentation

## 2015-12-04 DIAGNOSIS — Y999 Unspecified external cause status: Secondary | ICD-10-CM | POA: Diagnosis not present

## 2015-12-04 DIAGNOSIS — S50811A Abrasion of right forearm, initial encounter: Secondary | ICD-10-CM | POA: Diagnosis not present

## 2015-12-04 DIAGNOSIS — S59911A Unspecified injury of right forearm, initial encounter: Secondary | ICD-10-CM | POA: Diagnosis present

## 2015-12-04 DIAGNOSIS — Y9389 Activity, other specified: Secondary | ICD-10-CM | POA: Diagnosis not present

## 2015-12-04 MED ORDER — OXYCODONE HCL 5 MG PO TABS: 5.0000 mg | ORAL_TABLET | Freq: Four times a day (QID) | ORAL | Status: AC | PRN

## 2015-12-04 MED ORDER — METHOCARBAMOL 500 MG PO TABS
500.0000 mg | ORAL_TABLET | Freq: Once | ORAL | Status: AC
  Administered 2015-12-04: 500 mg via ORAL
  Filled 2015-12-04: qty 1

## 2015-12-04 MED ORDER — METHOCARBAMOL 500 MG PO TABS: 500.0000 mg | ORAL_TABLET | Freq: Two times a day (BID) | ORAL | Status: AC

## 2015-12-04 MED ORDER — ACETAMINOPHEN 500 MG PO TABS
1000.0000 mg | ORAL_TABLET | Freq: Once | ORAL | Status: AC
  Administered 2015-12-04: 1000 mg via ORAL
  Filled 2015-12-04: qty 2

## 2015-12-04 MED FILL — METHOCARBAMOL 500 MG TABLET: 500 | 10 days supply | Qty: 20 | Fill #0

## 2015-12-04 MED FILL — oxyCODONE HCL 5 MG TABS: 5 | 3 days supply | Qty: 11 | Fill #0

## 2015-12-04 NOTE — Discharge Instructions (Signed)
1. Medications: robaxin, oxycodone as needed in addition to the tylenol, tylenol 1000mg  4x per day for pain control, usual home medications 2. Treatment: rest, drink plenty of fluids, gentle stretching as discussed, alternate ice and heat 3. Follow Up: Please followup with your primary doctor in 3 days for discussion of your diagnoses and further evaluation after today's visit; if you do not have a primary care doctor use the resource guide provided to find one;  Return to the ER for worsening neck or back pain, difficulty walking, loss of bowel or bladder control or other concerning symptoms

## 2015-12-04 NOTE — ED Notes (Signed)
Patient transported to X-ray. Ambulates with steady gait
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
## 2015-12-25 DIAGNOSIS — M25511 Pain in right shoulder: Secondary | ICD-10-CM | POA: Diagnosis not present

## 2015-12-25 DIAGNOSIS — M7541 Impingement syndrome of right shoulder: Secondary | ICD-10-CM | POA: Diagnosis not present

## 2015-12-25 DIAGNOSIS — J329 Chronic sinusitis, unspecified: Secondary | ICD-10-CM | POA: Diagnosis not present

## 2015-12-25 DIAGNOSIS — M542 Cervicalgia: Secondary | ICD-10-CM | POA: Diagnosis not present

## 2016-01-22 DIAGNOSIS — I10 Essential (primary) hypertension: Secondary | ICD-10-CM | POA: Diagnosis not present

## 2016-01-22 DIAGNOSIS — M25511 Pain in right shoulder: Secondary | ICD-10-CM | POA: Diagnosis not present

## 2016-01-22 DIAGNOSIS — Z23 Encounter for immunization: Secondary | ICD-10-CM | POA: Diagnosis not present

## 2016-01-22 DIAGNOSIS — J309 Allergic rhinitis, unspecified: Secondary | ICD-10-CM | POA: Diagnosis not present

## 2016-01-22 DIAGNOSIS — E782 Mixed hyperlipidemia: Secondary | ICD-10-CM | POA: Diagnosis not present

## 2016-01-25 DIAGNOSIS — Z8781 Personal history of (healed) traumatic fracture: Secondary | ICD-10-CM | POA: Diagnosis not present

## 2016-06-03 DIAGNOSIS — J0100 Acute maxillary sinusitis, unspecified: Secondary | ICD-10-CM | POA: Diagnosis not present
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
## 2017-01-28 DIAGNOSIS — E7800 Pure hypercholesterolemia, unspecified: Secondary | ICD-10-CM | POA: Diagnosis not present

## 2017-01-28 DIAGNOSIS — Z0000 Encounter for general adult medical examination without abnormal findings: Secondary | ICD-10-CM | POA: Diagnosis not present

## 2018-02-06 DIAGNOSIS — J302 Other seasonal allergic rhinitis: Secondary | ICD-10-CM | POA: Diagnosis not present

## 2018-02-06 DIAGNOSIS — F4321 Adjustment disorder with depressed mood: Secondary | ICD-10-CM | POA: Diagnosis not present

## 2018-02-06 DIAGNOSIS — Z0000 Encounter for general adult medical examination without abnormal findings: Secondary | ICD-10-CM | POA: Diagnosis not present

## 2018-11-06 DIAGNOSIS — B029 Zoster without complications: Secondary | ICD-10-CM | POA: Diagnosis not present

## 2019-02-11 DIAGNOSIS — E7800 Pure hypercholesterolemia, unspecified: Secondary | ICD-10-CM | POA: Diagnosis not present

## 2019-02-11 DIAGNOSIS — Z125 Encounter for screening for malignant neoplasm of prostate: Secondary | ICD-10-CM | POA: Diagnosis not present

## 2019-02-11 DIAGNOSIS — Z0000 Encounter for general adult medical examination without abnormal findings: Secondary | ICD-10-CM | POA: Diagnosis not present

## 2019-12-17 DIAGNOSIS — R040 Epistaxis: Secondary | ICD-10-CM | POA: Diagnosis not present

## 2021-02-12 DIAGNOSIS — Z0000 Encounter for general adult medical examination without abnormal findings: Secondary | ICD-10-CM | POA: Diagnosis not present

## 2021-02-12 DIAGNOSIS — Z125 Encounter for screening for malignant neoplasm of prostate: Secondary | ICD-10-CM | POA: Diagnosis not present

## 2021-02-12 DIAGNOSIS — M25512 Pain in left shoulder: Secondary | ICD-10-CM | POA: Diagnosis not present

## 2021-02-12 DIAGNOSIS — R030 Elevated blood-pressure reading, without diagnosis of hypertension: Secondary | ICD-10-CM | POA: Diagnosis not present

## 2021-02-12 DIAGNOSIS — E7800 Pure hypercholesterolemia, unspecified: Secondary | ICD-10-CM | POA: Diagnosis not present

## 2021-02-12 DIAGNOSIS — M25522 Pain in left elbow: Secondary | ICD-10-CM | POA: Diagnosis not present

## 2021-02-28 DIAGNOSIS — R030 Elevated blood-pressure reading, without diagnosis of hypertension: Secondary | ICD-10-CM | POA: Diagnosis not present

## 2021-02-28 DIAGNOSIS — E7800 Pure hypercholesterolemia, unspecified: Secondary | ICD-10-CM | POA: Diagnosis not present

## 2022-02-21 DIAGNOSIS — E7800 Pure hypercholesterolemia, unspecified: Secondary | ICD-10-CM | POA: Diagnosis not present

## 2022-02-21 DIAGNOSIS — Z0000 Encounter for general adult medical examination without abnormal findings: Secondary | ICD-10-CM | POA: Diagnosis not present

## 2022-02-21 DIAGNOSIS — Z23 Encounter for immunization: Secondary | ICD-10-CM | POA: Diagnosis not present

## 2022-02-21 DIAGNOSIS — I10 Essential (primary) hypertension: Secondary | ICD-10-CM | POA: Diagnosis not present

## 2022-05-02 DIAGNOSIS — H04123 Dry eye syndrome of bilateral lacrimal glands: Secondary | ICD-10-CM | POA: Diagnosis not present

## 2022-05-02 DIAGNOSIS — H52203 Unspecified astigmatism, bilateral: Secondary | ICD-10-CM | POA: Diagnosis not present

## 2024-07-21 ENCOUNTER — Other Ambulatory Visit (HOSPITAL_BASED_OUTPATIENT_CLINIC_OR_DEPARTMENT_OTHER): Payer: Self-pay | Admitting: Internal Medicine

## 2024-07-21 DIAGNOSIS — E7800 Pure hypercholesterolemia, unspecified: Secondary | ICD-10-CM

## 2024-08-04 ENCOUNTER — Other Ambulatory Visit (HOSPITAL_BASED_OUTPATIENT_CLINIC_OR_DEPARTMENT_OTHER)
